# Patient Record
Sex: Female | Born: 1980 | Race: White | Hispanic: No | Marital: Married | Smoking: Never smoker
Health system: Southern US, Community
[De-identification: ages and names within clinical notes are randomized; demographics above are authoritative.]

## PROBLEM LIST (undated history)

## (undated) DIAGNOSIS — E119 Type 2 diabetes mellitus without complications: Secondary | ICD-10-CM

---

## 2009-07-23 ENCOUNTER — Emergency Department (HOSPITAL_COMMUNITY): Admission: EM | Admit: 2009-07-23 | Discharge: 2009-07-24 | Payer: Self-pay | Admitting: Emergency Medicine

## 2014-04-17 ENCOUNTER — Encounter (HOSPITAL_COMMUNITY): Payer: Self-pay | Admitting: Emergency Medicine

## 2014-04-17 ENCOUNTER — Emergency Department (HOSPITAL_COMMUNITY)
Admission: EM | Admit: 2014-04-17 | Discharge: 2014-04-18 | Disposition: A | Discharge status: Home / Self Care | Payer: Self-pay | Attending: Emergency Medicine | Admitting: Emergency Medicine

## 2014-04-17 DIAGNOSIS — Z79899 Other long term (current) drug therapy: Secondary | ICD-10-CM | POA: Insufficient documentation

## 2014-04-17 DIAGNOSIS — Z3202 Encounter for pregnancy test, result negative: Secondary | ICD-10-CM | POA: Insufficient documentation

## 2014-04-17 DIAGNOSIS — N730 Acute parametritis and pelvic cellulitis: Secondary | ICD-10-CM | POA: Insufficient documentation

## 2014-04-17 DIAGNOSIS — R109 Unspecified abdominal pain: Secondary | ICD-10-CM | POA: Insufficient documentation

## 2014-04-17 DIAGNOSIS — Z91040 Latex allergy status: Secondary | ICD-10-CM | POA: Insufficient documentation

## 2014-04-17 DIAGNOSIS — Z792 Long term (current) use of antibiotics: Secondary | ICD-10-CM | POA: Insufficient documentation

## 2014-04-17 DIAGNOSIS — N390 Urinary tract infection, site not specified: Secondary | ICD-10-CM | POA: Insufficient documentation

## 2014-04-17 DIAGNOSIS — R748 Abnormal levels of other serum enzymes: Secondary | ICD-10-CM | POA: Insufficient documentation

## 2014-04-17 LAB — COMPREHENSIVE METABOLIC PANEL
ALBUMIN: 3.8 g/dL (ref 3.5–5.2)
ALK PHOS: 105 U/L (ref 39–117)
ALT: 226 U/L — AB (ref 0–35)
ANION GAP: 14 (ref 5–15)
AST: 155 U/L — ABNORMAL HIGH (ref 0–37)
BUN: 9 mg/dL (ref 6–23)
CALCIUM: 10.3 mg/dL (ref 8.4–10.5)
CO2: 23 mEq/L (ref 19–32)
Chloride: 96 mEq/L (ref 96–112)
Creatinine, Ser: 0.61 mg/dL (ref 0.50–1.10)
GFR calc Af Amer: >90 mL/min (ref >90)
GFR calc non Af Amer: >90 mL/min (ref >90)
Glucose, Bld: 183 mg/dL — ABNORMAL HIGH (ref 70–99)
POTASSIUM: 3.8 meq/L (ref 3.7–5.3)
SODIUM: 133 meq/L — AB (ref 137–147)
TOTAL PROTEIN: 9.7 g/dL — AB (ref 6.0–8.3)
Total Bilirubin: 0.5 mg/dL (ref 0.3–1.2)

## 2014-04-17 LAB — URINALYSIS, ROUTINE W REFLEX MICROSCOPIC
Bilirubin Urine: NEGATIVE
GLUCOSE, UA: NEGATIVE mg/dL
Ketones, ur: NEGATIVE mg/dL
LEUKOCYTES UA: NEGATIVE
NITRITE: NEGATIVE
PH: 6 (ref 5.0–8.0)
PROTEIN: 30 mg/dL — AB
Specific Gravity, Urine: 1.027 (ref 1.005–1.030)
Urobilinogen, UA: 0.2 mg/dL (ref 0.0–1.0)

## 2014-04-17 LAB — LIPASE, BLOOD: Lipase: 29 U/L (ref 11–59)

## 2014-04-17 LAB — CBC WITH DIFFERENTIAL/PLATELET
BASOS ABS: 0 10*3/uL (ref 0.0–0.1)
Basophils Relative: 0 % (ref 0–1)
EOS ABS: 0.3 10*3/uL (ref 0.0–0.7)
Eosinophils Relative: 2 % (ref 0–5)
HCT: 35.7 % — ABNORMAL LOW (ref 36.0–46.0)
Hemoglobin: 11.7 g/dL — ABNORMAL LOW (ref 12.0–15.0)
LYMPHS PCT: 34 % (ref 12–46)
Lymphs Abs: 5.4 10*3/uL — ABNORMAL HIGH (ref 0.7–4.0)
MCH: 23 pg — ABNORMAL LOW (ref 26.0–34.0)
MCHC: 32.8 g/dL (ref 30.0–36.0)
MCV: 70.1 fL — ABNORMAL LOW (ref 78.0–100.0)
MONO ABS: 1.6 10*3/uL — AB (ref 0.1–1.0)
Monocytes Relative: 10 % (ref 3–12)
NEUTROS PCT: 54 % (ref 43–77)
Neutro Abs: 8.7 10*3/uL — ABNORMAL HIGH (ref 1.7–7.7)
PLATELETS: 290 10*3/uL (ref 150–400)
RBC: 5.09 MIL/uL (ref 3.87–5.11)
RDW: 13.9 % (ref 11.5–15.5)
WBC: 16 10*3/uL — ABNORMAL HIGH (ref 4.0–10.5)

## 2014-04-17 LAB — URINE MICROSCOPIC-ADD ON

## 2014-04-17 LAB — POC URINE PREG, ED: PREG TEST UR: NEGATIVE

## 2014-04-17 MED ORDER — ONDANSETRON HCL 4 MG/2ML IJ SOLN
4.0000 mg | Freq: Once | INTRAMUSCULAR | Status: AC
  Administered 2014-04-18: 4 mg via INTRAVENOUS
  Filled 2014-04-17: qty 2

## 2014-04-17 MED ORDER — SODIUM CHLORIDE 0.9 % IV BOLUS (SEPSIS)
1000.0000 mL | Freq: Once | INTRAVENOUS | Status: AC
  Administered 2014-04-18: 1000 mL via INTRAVENOUS

## 2014-04-17 MED ORDER — MORPHINE SULFATE 4 MG/ML IJ SOLN
4.0000 mg | INTRAMUSCULAR | Status: DC | PRN
  Administered 2014-04-18: 4 mg via INTRAVENOUS
  Filled 2014-04-17: qty 1

## 2014-04-17 NOTE — ED Provider Notes (Signed)
CSN: [id number]     Arrival date & time 04/17/14  1756 History   First MD Initiated Contact with Patient 04/17/14 2302     Chief Complaint  Patient presents with  . Abdominal Pain  . Back Pain     (Consider location/radiation/quality/duration/timing/severity/associated sxs/prior Treatment) HPI Comments: Pt with hx of niddm comes in with abd pain. Pt started having suprapubic pain, described as soreness, started 2 days ago. Patient also has lower back pain, middle and across. Patient also has pressure like feeling and sharpness around the vaginal area with urination. + nausea, no emesis, no fevers, chills. + polyuria, no hematuria today. No hx of pelvic disorders. + vaginal discharge, milky, no odor, thick. No hx of STD. PT married, unprotected intercourse with her partner. LMP was 03/15/14.  Patient is a 33 y.o. female presenting with abdominal pain and back pain. The history is provided by the patient.  Abdominal Pain Associated symptoms: dysuria, nausea and vaginal discharge   Associated symptoms: no chest pain, no constipation, no cough, no diarrhea, no fatigue, no fever, no hematuria, no shortness of breath and no vomiting   Back Pain Associated symptoms: abdominal pain and dysuria   Associated symptoms: no chest pain and no fever     History reviewed. No pertinent past medical history. History reviewed. No pertinent past surgical history. No family history on file. History  Substance Use Topics  . Smoking status: Never Smoker   . Smokeless tobacco: Never Used  . Alcohol Use: No   OB History   Grav Para Term Preterm Abortions TAB SAB Ect Mult Living                 Review of Systems  Constitutional: Negative for fever, activity change and fatigue.  HENT: Negative for facial swelling.   Respiratory: Negative for cough, shortness of breath and wheezing.   Cardiovascular: Negative for chest pain.  Gastrointestinal: Positive for nausea and abdominal pain. Negative for  vomiting, diarrhea, constipation, blood in stool and abdominal distention.  Endocrine: Positive for polyuria.  Genitourinary: Positive for dysuria, frequency, flank pain, vaginal discharge and vaginal pain. Negative for hematuria and difficulty urinating.  Musculoskeletal: Positive for back pain. Negative for neck pain.  Skin: Negative for color change.  Allergic/Immunologic: Negative for immunocompromised state.  Neurological: Negative for dizziness.  Hematological: Does not bruise/bleed easily.  Psychiatric/Behavioral: Negative for confusion.      Allergies  Latex  Home Medications   Prior to Admission medications   Medication Sig Start Date End Date Taking? Authorizing Provider  ibuprofen (ADVIL,MOTRIN) 200 MG tablet Take 200-800 mg by mouth every 6 (six) hours as needed for moderate pain.   Yes Historical Provider, MD  metFORMIN (GLUCOPHAGE) 1000 MG tablet Take 1,000 mg by mouth 2 (two) times daily with a meal.   Yes Historical Provider, MD  cephALEXin (KEFLEX) 500 MG capsule Take 1 capsule (500 mg total) by mouth 2 (two) times daily. 04/18/14   Derwood Kaplan, MD  doxycycline (VIBRAMYCIN) 100 MG capsule Take 1 capsule (100 mg total) by mouth 2 (two) times daily. 04/18/14   Derwood Kaplan, MD  promethazine (PHENERGAN) 25 MG suppository Place 1 suppository (25 mg total) rectally every 6 (six) hours as needed for nausea. 04/18/14   Derwood Kaplan, MD  promethazine (PHENERGAN) 25 MG tablet Take 1 tablet (25 mg total) by mouth every 6 (six) hours as needed for nausea. 04/18/14   Derwood Kaplan, MD  traMADol (ULTRAM) 50 MG tablet Take 1 tablet (50 mg  total) by mouth every 6 (six) hours as needed. 04/18/14   Fordyce Lepak Rhunette Croft, MD   BP 138/91  Pulse 102  Temp(Src) 98 F (36.7 C) (Oral)  Resp 16  Wt 197 lb (89.359 kg)  SpO2 99%  LMP 03/16/2014 Physical Exam  Nursing note and vitals reviewed. Constitutional: She is oriented to person, place, and time. She appears well-developed and  well-nourished.  HENT:  Head: Normocephalic and atraumatic.  Eyes: Conjunctivae and EOM are normal. Pupils are equal, round, and reactive to light.  Neck: Normal range of motion. Neck supple.  Cardiovascular: Normal rate, regular rhythm, normal heart sounds and intact distal pulses.   No murmur heard. Pulmonary/Chest: Effort normal. No respiratory distress. She has no wheezes.  Abdominal: Soft. Bowel sounds are normal. She exhibits no distension. There is tenderness. There is no rebound and no guarding.  Suprapubic tenderness, no RUE tenderness  Genitourinary: Vagina normal and uterus normal.  External exam - normal, no lesions Speculum exam: Pt has some white discharge, no blood Bimanual exam: Patient has MILD CMT, no adnexal tenderness or fullness and cervical os is closed  Neurological: She is alert and oriented to person, place, and time.  Skin: Skin is warm and dry.    ED Course  Procedures (including critical care time) Labs Review Labs Reviewed  WET PREP, GENITAL - Abnormal; Notable for the following:    WBC, Wet Prep HPF POC FEW (*)    All other components within normal limits  CBC WITH DIFFERENTIAL - Abnormal; Notable for the following:    WBC 16.0 (*)    Hemoglobin 11.7 (*)    HCT 35.7 (*)    MCV 70.1 (*)    MCH 23.0 (*)    Neutro Abs 8.7 (*)    Lymphs Abs 5.4 (*)    Monocytes Absolute 1.6 (*)    All other components within normal limits  COMPREHENSIVE METABOLIC PANEL - Abnormal; Notable for the following:    Sodium 133 (*)    Glucose, Bld 183 (*)    Total Protein 9.7 (*)    AST 155 (*)    ALT 226 (*)    All other components within normal limits  URINALYSIS, ROUTINE W REFLEX MICROSCOPIC - Abnormal; Notable for the following:    APPearance CLOUDY (*)    Hgb urine dipstick MODERATE (*)    Protein, ur 30 (*)    All other components within normal limits  URINE CULTURE  GC/CHLAMYDIA PROBE AMP  LIPASE, BLOOD  URINE MICROSCOPIC-ADD ON  POC URINE PREG, ED     Imaging Review No results found.   EKG Interpretation None      MDM   Final diagnoses:  UTI (lower urinary tract infection)  PID (acute pelvic inflammatory disease)  Abnormal liver enzymes    DDx includes: SBO ACS syndrome Colitis AAA Tumors Colitis Intra abdominal abscess Thrombosis Mesenteric ischemia Diverticulitis Peritonitis Appendicitis Hernia Nephrolithiasis Pyelonephritis UTI/Cystitis Ovarian cyst TOA Ectopic pregnancy PID STD  Pt comes in with cc of abd pain. Lower quadrant abd pain, with normal urine analysis results. Urine is cultured, pt is diabetic. Pt is also having typical UTI like sx, with dysuria - and given her diabetes, will treat her as if she has a UTI.  No concerns for Ovarian cyst or torsion, based on hx and exam. Pt also has vaginal discharge, with some cervical motion tenderness. Given equivocal findings thus far, despite low risk of STD currently, as she is in a monogamous relationship, we shall treat  her as if she has PID. We discussed this option with the patient, and she is ok with the appropriate measures.  Finally, elevated LFTs. Could be med related? GI f/u given.            Derwood Kaplan, MD 04/18/14 [phone]

## 2014-04-17 NOTE — ED Notes (Signed)
Pt states that she has been having LQ ABD pain and pressure in her vagina. Pt states that she has had some whitish vaginal discharge and some sharp pain during urination and polyuria.

## 2014-04-18 LAB — WET PREP, GENITAL
CLUE CELLS WET PREP: NONE SEEN
Trich, Wet Prep: NONE SEEN
Yeast Wet Prep HPF POC: NONE SEEN

## 2014-04-18 MED ORDER — PROMETHAZINE HCL 25 MG RE SUPP: 25.0000 mg | Freq: Four times a day (QID) | RECTAL | Status: AC | PRN

## 2014-04-18 MED ORDER — AZITHROMYCIN 250 MG PO TABS
1000.0000 mg | ORAL_TABLET | Freq: Once | ORAL | Status: AC
  Administered 2014-04-18: 1000 mg via ORAL
  Filled 2014-04-18: qty 4

## 2014-04-18 MED ORDER — CEPHALEXIN 500 MG PO CAPS: 500.0000 mg | ORAL_CAPSULE | Freq: Two times a day (BID) | ORAL | Status: DC

## 2014-04-18 MED ORDER — DOXYCYCLINE HYCLATE 100 MG PO CAPS: 100.0000 mg | ORAL_CAPSULE | Freq: Two times a day (BID) | ORAL | Status: DC

## 2014-04-18 MED ORDER — DEXTROSE 5 % IV SOLN
1.0000 g | Freq: Once | INTRAVENOUS | Status: AC
  Administered 2014-04-18: 1 g via INTRAVENOUS
  Filled 2014-04-18: qty 10

## 2014-04-18 MED ORDER — PROMETHAZINE HCL 25 MG PO TABS: 25.0000 mg | ORAL_TABLET | Freq: Four times a day (QID) | ORAL | Status: AC | PRN

## 2014-04-18 MED ORDER — TRAMADOL HCL 50 MG PO TABS: 50.0000 mg | ORAL_TABLET | Freq: Four times a day (QID) | ORAL | Status: AC | PRN

## 2014-04-18 NOTE — Discharge Instructions (Signed)
We saw you in the ER for the abdominal pain. Concerns for possible Urinary tract infection, and also pelvic infection. Take the medications provided. See the GI doctor  -as your liver enzymes are slightly elevated. See your primary doctor in 1 week for a routine follow up.  Please return to the ER if your symptoms worsen; you have increased pain, fevers, chills, inability to keep any medications down, confusion. Otherwise see the outpatient doctor as requested.   Pelvic Inflammatory Disease Pelvic inflammatory disease (PID) refers to an infection in some or all of the female organs. The infection can be in the uterus, ovaries, fallopian tubes, or the surrounding tissues in the pelvis. PID can cause abdominal or pelvic pain that comes on suddenly (acute pelvic pain). PID is a serious infection because it can lead to lasting (chronic) pelvic pain or the inability to have children (infertile).  CAUSES  The infection is often caused by the normal bacteria found in the vaginal tissues. PID may also be caused by an infection that is spread during sexual contact. PID can also occur following:   The birth of a baby.   A miscarriage.   An abortion.   Major pelvic surgery.   The use of an intrauterine device (IUD).   A sexual assault.  RISK FACTORS Certain factors can put a person at higher risk for PID, such as:  Being younger than 25 years.  Being sexually active at [place] age.  Usingnonbarrier contraception.  Havingmultiple sexual partners.  Having sex with someone who has symptoms of a genital infection.  Using oral contraception. Other times, certain behaviors can increase the possibility of getting PID, such as:  Having sex during your period.  Using a vaginal douche.  Having an intrauterine device (IUD) in place. SYMPTOMS   Abdominal or pelvic pain.   Fever.   Chills.   Abnormal vaginal discharge.  Abnormal uterine bleeding.   Unusual pain shortly  after finishing your period. DIAGNOSIS  Your caregiver will choose some of the following methods to make a diagnosis, such as:   Performinga physical exam and history. A pelvic exam typically reveals a very tender uterus and surrounding pelvis.   Ordering laboratory tests including a pregnancy test, blood tests, and urine test.  Orderingcultures of the vagina and cervix to check for a sexually transmitted infection (STI).  Performing an ultrasound.   Performing a laparoscopic procedure to look inside the pelvis.  TREATMENT   Antibiotic medicines may be prescribed and taken by mouth.   Sexual partners may be treated when the infection is caused by a sexually transmitted disease (STD).   Hospitalization may be needed to give antibiotics intravenously.  Surgery may be needed, but this is rare. It may take weeks until you are completely well. If you are diagnosed with PID, you should also be checked for human immunodeficiency virus (HIV). HOME CARE INSTRUCTIONS   If given, take your antibiotics as directed. Finish the medicine even if you start to feel better.   Only take over-the-counter or prescription medicines for pain, discomfort, or fever as directed by your caregiver.   Do not have sexual intercourse until treatment is completed or as directed by your caregiver. If PID is confirmed, your recent sexual partner(s) will need treatment.   Keep your follow-up appointments. SEEK MEDICAL CARE IF:   You have increased or abnormal vaginal discharge.   You need prescription medicine for your pain.   You vomit.   You cannot take your medicines.  Your partner has an STD.  SEEK IMMEDIATE MEDICAL CARE IF:   You have a fever.   You have increased abdominal or pelvic pain.   You have chills.   You have pain when you urinate.   You are not better after 72 hours following treatment.  MAKE SURE YOU:   Understand these instructions.  Will watch your  condition.  Will get help right away if you are not doing well or get worse. Document Released: 07/21/2005 Document Revised: 11/15/2012 Document Reviewed: 07/17/2011 [facility] Patient Information 2015 [place], [place]. This information is not intended to replace advice given to you by your health care provider. Make sure you discuss any questions you have with your health care provider.  Urinary Tract Infection Urinary tract infections (UTIs) can develop anywhere along your urinary tract. Your urinary tract is your body's drainage system for removing wastes and extra water. Your urinary tract includes two kidneys, two ureters, a bladder, and a urethra. Your kidneys are a pair of bean-shaped organs. Each kidney is about the size of your fist. They are located below your ribs, one on each side of your spine. CAUSES Infections are caused by microbes, which are microscopic organisms, including fungi, viruses, and bacteria. These organisms are so small that they can only be seen through a microscope. Bacteria are the microbes that most commonly cause UTIs. SYMPTOMS  Symptoms of UTIs may vary by age and gender of the patient and by the location of the infection. Symptoms in young women typically include a frequent and intense urge to urinate and a painful, burning feeling in the bladder or urethra during urination. Older women and men are more likely to be tired, shaky, and weak and have muscle aches and abdominal pain. A fever may mean the infection is in your kidneys. Other symptoms of a kidney infection include pain in your back or sides below the ribs, nausea, and vomiting. DIAGNOSIS To diagnose a UTI, your caregiver will ask you about your symptoms. Your caregiver also will ask to provide a urine sample. The urine sample will be tested for bacteria and white blood cells. White blood cells are made by your body to help fight infection. TREATMENT  Typically, UTIs can be treated with medication. Because  most UTIs are caused by a bacterial infection, they usually can be treated with the use of antibiotics. The choice of antibiotic and length of treatment depend on your symptoms and the type of bacteria causing your infection. HOME CARE INSTRUCTIONS  If you were prescribed antibiotics, take them exactly as your caregiver instructs you. Finish the medication even if you feel better after you have only taken some of the medication.  Drink enough water and fluids to keep your urine clear or pale yellow.  Avoid caffeine, tea, and carbonated beverages. They tend to irritate your bladder.  Empty your bladder often. Avoid holding urine for long periods of time.  Empty your bladder before and after sexual intercourse.  After a bowel movement, women should cleanse from front to back. Use each tissue only once. SEEK MEDICAL CARE IF:   You have back pain.  You develop a fever.  Your symptoms do not begin to resolve within 3 days. SEEK IMMEDIATE MEDICAL CARE IF:   You have severe back pain or lower abdominal pain.  You develop chills.  You have nausea or vomiting.  You have continued burning or discomfort with urination. MAKE SURE YOU:   Understand these instructions.  Will watch your condition.  Will get help right away if you are not doing well or get worse. Document Released: 04/30/2005 Document Revised: 01/20/2012 Document Reviewed: 08/29/2011 [facility] Patient Information 2015 [place], [place]. This information is not intended to replace advice given to you by your health care provider. Make sure you discuss any questions you have with your health care provider.

## 2014-04-19 LAB — URINE CULTURE
CULTURE: NO GROWTH
Colony Count: NO GROWTH
SPECIAL REQUESTS: NORMAL

## 2014-04-19 LAB — GC/CHLAMYDIA PROBE AMP
CT PROBE, AMP APTIMA: NEGATIVE
GC PROBE AMP APTIMA: NEGATIVE

## 2014-09-05 ENCOUNTER — Emergency Department (HOSPITAL_BASED_OUTPATIENT_CLINIC_OR_DEPARTMENT_OTHER): Payer: Self-pay

## 2014-09-05 ENCOUNTER — Emergency Department (HOSPITAL_BASED_OUTPATIENT_CLINIC_OR_DEPARTMENT_OTHER)
Admission: EM | Admit: 2014-09-05 | Discharge: 2014-09-05 | Disposition: A | Discharge status: Home / Self Care | Payer: Self-pay | Attending: Emergency Medicine | Admitting: Emergency Medicine

## 2014-09-05 ENCOUNTER — Encounter (HOSPITAL_BASED_OUTPATIENT_CLINIC_OR_DEPARTMENT_OTHER): Payer: Self-pay | Admitting: *Deleted

## 2014-09-05 DIAGNOSIS — W19XXXA Unspecified fall, initial encounter: Secondary | ICD-10-CM

## 2014-09-05 DIAGNOSIS — L0291 Cutaneous abscess, unspecified: Secondary | ICD-10-CM

## 2014-09-05 DIAGNOSIS — M545 Low back pain, unspecified: Secondary | ICD-10-CM

## 2014-09-05 DIAGNOSIS — W108XXA Fall (on) (from) other stairs and steps, initial encounter: Secondary | ICD-10-CM | POA: Insufficient documentation

## 2014-09-05 DIAGNOSIS — Y998 Other external cause status: Secondary | ICD-10-CM | POA: Insufficient documentation

## 2014-09-05 DIAGNOSIS — S3992XA Unspecified injury of lower back, initial encounter: Secondary | ICD-10-CM | POA: Insufficient documentation

## 2014-09-05 DIAGNOSIS — Z91040 Latex allergy status: Secondary | ICD-10-CM | POA: Insufficient documentation

## 2014-09-05 DIAGNOSIS — Z792 Long term (current) use of antibiotics: Secondary | ICD-10-CM | POA: Insufficient documentation

## 2014-09-05 DIAGNOSIS — Y9289 Other specified places as the place of occurrence of the external cause: Secondary | ICD-10-CM | POA: Insufficient documentation

## 2014-09-05 DIAGNOSIS — Z79899 Other long term (current) drug therapy: Secondary | ICD-10-CM | POA: Insufficient documentation

## 2014-09-05 DIAGNOSIS — L02211 Cutaneous abscess of abdominal wall: Secondary | ICD-10-CM | POA: Insufficient documentation

## 2014-09-05 DIAGNOSIS — Y9389 Activity, other specified: Secondary | ICD-10-CM | POA: Insufficient documentation

## 2014-09-05 MED ORDER — HYDROCODONE-ACETAMINOPHEN 5-325 MG PO TABS: 1.0000 | ORAL_TABLET | Freq: Four times a day (QID) | ORAL | Status: DC | PRN

## 2014-09-05 MED ORDER — OXYCODONE-ACETAMINOPHEN 5-325 MG PO TABS
2.0000 | ORAL_TABLET | Freq: Once | ORAL | Status: AC
  Administered 2014-09-05: 2 via ORAL
  Filled 2014-09-05: qty 2

## 2014-09-05 MED ORDER — LIDOCAINE-EPINEPHRINE (PF) 2 %-1:200000 IJ SOLN
10.0000 mL | Freq: Once | INTRAMUSCULAR | Status: AC
  Administered 2014-09-05: 10 mL
  Filled 2014-09-05: qty 20

## 2014-09-05 MED ORDER — CEPHALEXIN 500 MG PO CAPS: 500.0000 mg | ORAL_CAPSULE | Freq: Four times a day (QID) | ORAL | Status: DC

## 2014-09-05 MED ORDER — SULFAMETHOXAZOLE-TRIMETHOPRIM 800-160 MG PO TABS: 2.0000 | ORAL_TABLET | Freq: Two times a day (BID) | ORAL | Status: DC

## 2014-09-05 NOTE — ED Notes (Signed)
Patient transported to X-ray 

## 2014-09-05 NOTE — ED Notes (Addendum)
States she fell on the steps and hurt her back. Redness to her mid back that looks like a heating pad burn. She also wants to have a reddened area to her left lower quadrant to be checked for possible abscess. Lethargic at triage unable to keep her eyes open.
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
## 2014-09-24 ENCOUNTER — Encounter (HOSPITAL_BASED_OUTPATIENT_CLINIC_OR_DEPARTMENT_OTHER): Payer: Self-pay | Admitting: *Deleted

## 2014-09-24 ENCOUNTER — Emergency Department (HOSPITAL_BASED_OUTPATIENT_CLINIC_OR_DEPARTMENT_OTHER)
Admission: EM | Admit: 2014-09-24 | Discharge: 2014-09-24 | Disposition: A | Discharge status: Home / Self Care | Payer: Self-pay | Attending: Emergency Medicine | Admitting: Emergency Medicine

## 2014-09-24 DIAGNOSIS — K0889 Other specified disorders of teeth and supporting structures: Secondary | ICD-10-CM

## 2014-09-24 DIAGNOSIS — E119 Type 2 diabetes mellitus without complications: Secondary | ICD-10-CM | POA: Insufficient documentation

## 2014-09-24 DIAGNOSIS — K088 Other specified disorders of teeth and supporting structures: Secondary | ICD-10-CM | POA: Insufficient documentation

## 2014-09-24 DIAGNOSIS — Z79899 Other long term (current) drug therapy: Secondary | ICD-10-CM | POA: Insufficient documentation

## 2014-09-24 DIAGNOSIS — Z792 Long term (current) use of antibiotics: Secondary | ICD-10-CM | POA: Insufficient documentation

## 2014-09-24 DIAGNOSIS — Z91040 Latex allergy status: Secondary | ICD-10-CM | POA: Insufficient documentation

## 2014-09-24 HISTORY — DX: Type 2 diabetes mellitus without complications: E11.9

## 2014-09-24 MED ORDER — HYDROCODONE-ACETAMINOPHEN 5-325 MG PO TABS: 1.0000 | ORAL_TABLET | ORAL | Status: AC | PRN

## 2014-09-24 MED ORDER — IBUPROFEN 800 MG PO TABS
800.0000 mg | ORAL_TABLET | Freq: Once | ORAL | Status: AC
  Administered 2014-09-24: 800 mg via ORAL
  Filled 2014-09-24: qty 1

## 2014-09-24 MED ORDER — HYDROCODONE-ACETAMINOPHEN 5-325 MG PO TABS
2.0000 | ORAL_TABLET | Freq: Once | ORAL | Status: AC
  Administered 2014-09-24: 2 via ORAL
  Filled 2014-09-24: qty 2

## 2014-09-24 MED ORDER — IBUPROFEN 800 MG PO TABS: 800.0000 mg | ORAL_TABLET | Freq: Three times a day (TID) | ORAL | Status: AC | PRN

## 2014-09-24 NOTE — ED Notes (Signed)
Pt admits to left/right lower dental pain x2 months - progressively worse - unable to tolerate pain today. Has taken tylenol and ibuprofen for pain w/o relief.

## 2014-09-24 NOTE — ED Provider Notes (Signed)
TIME SEEN: This chart was scribed for Rebecca MawKristen N Ellise Hayden, Rebecca Hayden by Murriel HopperAlec Bankhead, ED Scribe. This patient was seen in room MH05/MH05 and the patient's care was started at 11:10 PM.  CHIEF COMPLAINT:  Chief Complaint  Patient presents with  . Dental Pain     HPI: HPI Comments: Rebecca Hayden is a 34 y.o. female with history of diabetes who presents to the Emergency Department complaining of constant, worsening bilateral mandibular dental pain that has been present for several weeks. One molar on both the left and right side of her mandibular jaw are causing her pain, as she notes she may have chipped them at some point in the last couple of weeks. Pt states that she has aching pain from her chipped teeth, and that her pain radiates into her head and jaw to give her headaches and jaw pain. Denies fever. No facial swelling or erythema. No trismus or drooling. States she does not have a Education officer, communitydentist.    ROS: See HPI Constitutional: no fever  Eyes: no drainage  ENT: no runny nose , dental pain Cardiovascular:  no chest pain  Resp: no SOB  GI: no vomiting GU: no dysuria Integumentary: no rash  Allergy: no hives  Musculoskeletal: no leg swelling  Neurological: no slurred speech, headache ROS otherwise negative  PAST MEDICAL HISTORY/PAST SURGICAL HISTORY:  Past Medical History  Diagnosis Date  . Diabetes mellitus without complication     MEDICATIONS:  Prior to Admission medications   Medication Sig Start Date End Date Taking? Authorizing Provider  cephALEXin (KEFLEX) 500 MG capsule Take 1 capsule (500 mg total) by mouth 4 (four) times daily. 09/05/14   Santiago GladHeather Laisure, PA-C  doxycycline (VIBRAMYCIN) 100 MG capsule Take 1 capsule (100 mg total) by mouth 2 (two) times daily. 04/18/14   Derwood KaplanAnkit Nanavati, MD  HYDROcodone-acetaminophen (NORCO/VICODIN) 5-325 MG per tablet Take 1-2 tablets by mouth every 6 (six) hours as needed. 09/05/14   Heather Laisure, PA-C  ibuprofen (ADVIL,MOTRIN) 200 MG tablet Take  200-800 mg by mouth every 6 (six) hours as needed for moderate pain.    Historical Provider, MD  metFORMIN (GLUCOPHAGE) 1000 MG tablet Take 1,000 mg by mouth 2 (two) times daily with a meal.    Historical Provider, MD  promethazine (PHENERGAN) 25 MG suppository Place 1 suppository (25 mg total) rectally every 6 (six) hours as needed for nausea. 04/18/14   Derwood KaplanAnkit Nanavati, MD  promethazine (PHENERGAN) 25 MG tablet Take 1 tablet (25 mg total) by mouth every 6 (six) hours as needed for nausea. 04/18/14   Derwood KaplanAnkit Nanavati, MD  sulfamethoxazole-trimethoprim (SEPTRA DS) 800-160 MG per tablet Take 2 tablets by mouth every 12 (twelve) hours. 09/05/14   Santiago GladHeather Laisure, PA-C  traMADol (ULTRAM) 50 MG tablet Take 1 tablet (50 mg total) by mouth every 6 (six) hours as needed. 04/18/14   Derwood KaplanAnkit Nanavati, MD    ALLERGIES:  Allergies  Allergen Reactions  . Latex Hives and Itching    SOCIAL HISTORY:  History  Substance Use Topics  . Smoking status: Never Smoker   . Smokeless tobacco: Never Used  . Alcohol Use: No    FAMILY HISTORY: History reviewed. No pertinent family history.  EXAM: BP 115/78 mmHg  Pulse 112  Temp(Src) 98.4 F (36.9 C) (Oral)  Resp 20  Ht 5' (1.524 m)  Wt 210 lb (95.255 kg)  BMI 41.01 kg/m2  SpO2 100%  LMP 09/18/2014 (Approximate) CONSTITUTIONAL: Alert and oriented and responds appropriately to questions. Well-appearing; well-nourished HEAD: Normocephalic EYES: Conjunctivae  clear, PERRL ENT: normal nose; no rhinorrhea; moist mucous membranes; pharynx without lesions noted, dental caries bilateral bottom 3rd molars have chipped areas with no dentin exposure, gum surrounding left third molar is inflamed and irritated without signs of abscess, no trismus or drooling, no submandibular swelling, normal phonation; tongue sits flat in the bottom of the mouth; no tonsillar hypertrophy or exudate, no uvular deviation NECK: Supple, no meningismus, no LAD  CARD: RRR; S1 and S2 appreciated; no  murmurs, no clicks, no rubs, no gallops RESP: Normal chest excursion without splinting or tachypnea; breath sounds clear and equal bilaterally; no wheezes, no rhonchi, no rales,  ABD/GI: Normal bowel sounds; non-distended; soft, non-tender, no rebound, no guarding BACK:  The back appears normal and is non-tender to palpation, there is no CVA tenderness EXT: Normal ROM in all joints; non-tender to palpation; no edema; normal capillary refill; no cyanosis    SKIN: Normal color for age and race; warm NEURO: Moves all extremities equally PSYCH: The patient's mood and manner are appropriate. Grooming and personal hygiene are appropriate.  MEDICAL DECISION MAKING: Patient here with dental pain from dental caries in chipped teeth and placed him teeth that are coming in and causing gum irritation. No obvious sign of abscess but will discharge on antibiotics and with pain medicine. Will give outpatient dental follow-up information. Discussed return precautions. No sign of Ludwig angina on exam. She verbalizes understanding and is comfortable with plan.    1:30 AM  Called in PCN  QID x 10 days to [facility] at [phone].  Discussed this with patient, called her at her home at [phone].  She will pick this up from the pharmacy tomorrow.    I personally performed the services described in this documentation, which was scribed in my presence. The recorded information has been reviewed and is accurate.   Rebecca Hayden, Rebecca Hayden [phone]

## 2014-09-24 NOTE — Discharge Instructions (Signed)
Dental Pain °A tooth ache may be caused by cavities (tooth decay). Cavities expose the nerve of the tooth to air and hot or cold temperatures. It may come from an infection or abscess (also called a boil or furuncle) around your tooth. It is also often caused by dental caries (tooth decay). This causes the pain you are having. °DIAGNOSIS  °Your caregiver can diagnose this problem by exam. °TREATMENT  °· If caused by an infection, it may be treated with medications which kill germs (antibiotics) and pain medications as prescribed by your caregiver. Take medications as directed. °· Only take over-the-counter or prescription medicines for pain, discomfort, or fever as directed by your caregiver. °· Whether the tooth ache today is caused by infection or dental disease, you should see your dentist as soon as possible for further care. °SEEK MEDICAL CARE IF: °The exam and treatment you received today has been provided on an emergency basis only. This is not a substitute for complete medical or dental care. If your problem worsens or new problems (symptoms) appear, and you are unable to meet with your dentist, call or return to this location. °SEEK IMMEDIATE MEDICAL CARE IF:  °· You have a fever. °· You develop redness and swelling of your face, jaw, or neck. °· You are unable to open your mouth. °· You have severe pain uncontrolled by pain medicine. °MAKE SURE YOU:  °· Understand these instructions. °· Will watch your condition. °· Will get help right away if you are not doing well or get worse. °Document Released: 07/21/2005 Document Revised: 10/13/2011 Document Reviewed: 03/08/2008 °ExitCare® Patient Information ©2015 [employer]. This information is not intended to replace advice given to you by your health care provider. Make sure you discuss any questions you have with your health care provider. ° °Emergency Department Resource Guide °1) Find a Doctor and Pay Out of Pocket °Although you won't have to find out who  is covered by your insurance plan, it is a good idea to ask around and get recommendations. You will then need to call the office and see if the doctor you have chosen will accept you as a new patient and what types of options they offer for patients who are self-pay. Some doctors offer discounts or will set up payment plans for their patients who do not have insurance, but you will need to ask so you aren't surprised when you get to your appointment. ° °2) Contact Your Local Health Department °Not all health departments have doctors that can see patients for sick visits, but many do, so it is worth a call to see if yours does. If you don't know where your local health department is, you can check in your phone book. The CDC also has a tool to help you locate your state's health department, and many state websites also have listings of all of their local health departments. ° °3) Find a Walk-in Clinic °If your illness is not likely to be very severe or complicated, you may want to try a walk in clinic. These are popping up all over the country in pharmacies, drugstores, and shopping centers. They're usually staffed by nurse practitioners or physician assistants that have been trained to treat common illnesses and complaints. They're usually fairly quick and inexpensive. However, if you have serious medical issues or chronic medical problems, these are probably not your best option. ° °No Primary Care Doctor: °- Call [facility] at  832-8000 - they can help you locate a primary   care doctor that  accepts your insurance, provides certain services, etc. °- Physician Referral Service- 1-800-533-3463 ° °Chronic Pain Problems: °Organization         Address  Phone   Notes  °[facility]  (336) 297-2271 Patients need to be referred by their primary care doctor.  ° °Medication Assistance: °Organization         Address  Phone   Notes  °[place] [facility] [address]  [address] °[place], [place] [address] (336) 641-8030 --Must be a resident of [place] °-- Must have NO insurance coverage whatsoever (no Medicaid/ Medicare, etc.) °-- The pt. MUST have a primary care doctor that directs their care regularly and follows them in the community °  °[facility]  (866) 331-1348   °[employer]  (888) 892-1162   ° °Agencies that provide inexpensive medical care: °Organization         Address  Phone   Notes  °[facility]  (336) 832-8035   °[facility]    (336) 832-7272   °[facility] [address] °[place], [place] [address] (336) 832-4777   °[facility] [address], °[place] (336) 271-4999   °[facility]    (336) 373-0678   °[facility]    (336) 272-1050   °[facility] ° [address], [place] Phone:  (336) 832-4444, Fax:  (336) 832-4440 Hours of Operation:  9 am - 6 pm, M-F.  Also accepts Medicaid/Medicare and self-pay.  °[facility] ° [address], [place] Phone: (336) 832-3150, Fax: (336) 832-3151. Hours of Operation:  8:30 am - 5:30 pm, M-F.  Also accepts Medicaid and self-pay.  °[facility] [place] [address], [place] Phone: (336) 878-6027   °[facility] [address], [place], [place] (336)723-1848, Ext. 123 Mondays & Thursdays: 7-9 AM.  First 15 patients are seen on a first come, first serve basis. °  ° °Medicaid-accepting [place] Providers: ° °Organization         Address  Phone   Notes  °[facility] [address], [place] (336) 641-2100 Also accepts self-pay patients.  °[facility] [address], [place] ° (336) 856-9996   °[facility] [address], [place] (336) 288-8857   °[facility] [address] [place] [address], [place] (336) 299-7000   °Veita Bland [address], [place]  ° (336) 373-1557 Only accepts [place] Access Medicaid patients after they have their name applied to their card.  ° °Self-Pay (no insurance) in [place]: ° °Organization         Address  Phone   Notes  °Sickle Cell Patients, [facility] [address], [place] (336) 832-1970   °[facility] [address], [place] (336) 832-4400   °[facility] ° [address], [place] (336) 992-4800   °[facility]/Dr. Osei-Bonsu ° [address] [place] [address], [place] or [address], [place] (336) 841-8500 Phone number for both [place] and [place] locations is the same.  °[facility] [address], [place] (336) 299-0000   °[facility] [address] [place] [address], [place] or [address] (336) 852-7530 °(336) 878-2260   °  [facility] [address], [place] (336) 350-1642, phone; (336) 294-5005, fax Sees patients 1st and 3rd Saturday of every month.  Must not qualify for public or private insurance (i.e. Medicaid, Medicare, [place] Health Choice, Veterans' Benefits) • Household income should be no more than 200% of the poverty level •The clinic cannot treat you if you are pregnant or think you are pregnant • Sexually transmitted diseases are not treated at the clinic.  ° ° °Dental Care: °Organization         Address  Phone  Notes  °[facility] [facility] [address], [place] (336) 641-6152 Accepts children up to age 21 who are enrolled in Medicaid or [place] Health Choice; pregnant women with a Medicaid card; and children who have applied for Medicaid or [place] Health Choice, but were declined, whose parents can pay a reduced fee at time of service.  °[facility] [place]  [address], [place] (336) 641-7733 Accepts children up to age 21 who are  enrolled in Medicaid or [place] Health Choice; pregnant women with a Medicaid card; and children who have applied for Medicaid or [place] Health Choice, but were declined, whose parents can pay a reduced fee at time of service.  °[facility] ° [address], [place] (336) 641-4533 Patients are seen by appointment only. Walk-ins are not accepted. [facility] will see patients 18 years of age and older. °Monday - Tuesday (8am-5pm) °Most Wednesdays (8:30-5pm) °$30 per visit, cash only  °[facility] ° [address], [place] (336) 641-4533 Patients are seen by appointment only. Walk-ins are not accepted. [facility] will see patients 18 years of age and older. °One Wednesday Evening (Monthly: Volunteer Based).  $30 per visit, cash only  °[facility]  (919) 537-3737 for adults; Children under age 4, call Graduate Pediatric Dentistry at (919) [phone]. Children aged 4-14, please call (919) [phone] to request a pediatric application. ° Dental services are provided in all areas of dental care including fillings, crowns and bridges, complete and partial dentures, implants, gum treatment, root canals, and extractions. Preventive care is also provided. Treatment is provided to both adults and children. °Patients are selected via a lottery and there is often a waiting list. °  °[facility] [address], °[place] ° (336) 763-8833 [email or web address] °  °[facility] [address], [place], [place] (336)723-1848, Ext. 123 Second and Fourth Thursday of each month, opens at 6:30 AM; Clinic ends at 9 AM.  Patients are seen on a first-come first-served basis, and a limited number are seen during each clinic.  ° °[facility] ° [address], [place], [place] (336) 723-7904   Eligibility Requirements °You must have lived in [place], [place], or [place] counties for at least the last three months. °  You  cannot be eligible for state or federal sponsored healthcare insurance, including Veterans Administration, Medicaid, or Medicare. °  You generally cannot be eligible for healthcare insurance through your employer.  °  How to apply: °Eligibility screenings are held every Tuesday and Wednesday afternoon from 1:00 pm until 4:00 pm. You do not need an appointment for the interview!  °[facility] [address], [place], [place] 336-631-2330   °[facility] [facility]  336-342-8273   °[place] [facility]  336-703-3100   °[facility]  336-570-6415   ° °Behavioral Health Resources in the Community: °Intensive Outpatient Programs °Organization         Address  Phone  Notes  °[facility] [address], [place], [place] 336-878-6098   °[facility] [address], [place], [place] 336-832-9800   °[facility] [address], [place], [place] ° 336-882-2125   °[facility] [address],  °[place], [place] 1-800-853-5163 or 336-641-4981   °Substance Abuse Resources °Organization         Address  Phone  Notes  °[facility]  336-882-2125   °[facility]  336-784-9470   °[facility]  336-285-9073   °[facility]  336-845-3988   °Residential & Outpatient Substance Abuse Program  1-800-659-3381   °Psychological Services °Organization         Address  Phone  Notes  ° Health  336- 832-9600   °[facility]  336- 378-7881   °[facility] [address], [place] 1-800-853-5163 or 336-641-4981   ° °Mobile Crisis Teams °Organization         Address  Phone  Notes  °[facility], Mobile Crisis Care Unit  1-877-626-1772   °Assertive °[facility] ° [address] [place], [place] 336-834-9664   °Sharon DeEsch [address] °[place] 336-554-5454   ° °Self-Help/Support  Groups °Organization         Address  Phone             Notes  °[facility] [place] - variety of support groups  336- 373-1402 Call for more information  °Narcotics Anonymous (NA), [facility] [address], °[place]  2 meetings at this location  ° °Residential Treatment Programs °Organization         Address  Phone  Notes  °[facility] [address],    °[place]  1-866-801-8205   °[facility] ° [address], [place], [place] 704-293-8524   °[facility] [address], [place] 336-845-3988 Admissions: 8am-3pm M-F  °[facility] [address]    °[place], [place] 336-841-1104   °[facility] [address], [place], [place] 336-379-7146   °[facility] [address]  °[place], [place] 336-285-9073   °[facility] - Intensive Outpatient [address], [place], [place] 336-852-3033   °[facility] [address]  °[place], [place] 1-877-615-2722 or 336-784-9470   °[facility] [address] [place], [place] 336-227-7417 Accepts Medicaid  °[facility] [address]  ° [place] 1-800-659-3381 Substance Abuse/Addiction Treatment  ° °[place] Behavioral Health Resources °Organization         Address  Phone  Notes  °[facility]  (888) 581-9988   °Julie Brannon, PhD [address] [place], [place]   (336) 349-5553 or (336) 951-0000   °[facility]   [address] °[place], [place] (336) 349-4454   °[facility] [address], [place], [place] (336) 342-8316 Insurance/Medicaid/sponsorship through [facility]  °[facility] [address]                                    [place],  (336) 342-8316 Therapy/tele-psych/case  °[facility]   [address]  ° [place], [place] (336) 349-2233    °Dr. Arfeen  (336) 349-4544   °[facility] [place]  [employer] [facility] [place] [facility] 1) [address], [place] °2) [address], [place] °3)  [address], [place] (336) 349-3220 °(336) 342-7768 ° °(336) 342-8140   °[place] [facility] (336) 342-1394 or (336) 342-3537 (After Hours)    ° ° ° °

## 2014-11-04 ENCOUNTER — Encounter (HOSPITAL_BASED_OUTPATIENT_CLINIC_OR_DEPARTMENT_OTHER): Payer: Self-pay | Admitting: *Deleted

## 2014-11-04 ENCOUNTER — Emergency Department (HOSPITAL_BASED_OUTPATIENT_CLINIC_OR_DEPARTMENT_OTHER): Payer: Self-pay

## 2014-11-04 ENCOUNTER — Emergency Department (HOSPITAL_BASED_OUTPATIENT_CLINIC_OR_DEPARTMENT_OTHER)
Admission: EM | Admit: 2014-11-04 | Discharge: 2014-11-04 | Disposition: A | Discharge status: Home / Self Care | Payer: Self-pay | Attending: Emergency Medicine | Admitting: Emergency Medicine

## 2014-11-04 DIAGNOSIS — Z79899 Other long term (current) drug therapy: Secondary | ICD-10-CM | POA: Insufficient documentation

## 2014-11-04 DIAGNOSIS — Y9289 Other specified places as the place of occurrence of the external cause: Secondary | ICD-10-CM | POA: Insufficient documentation

## 2014-11-04 DIAGNOSIS — S80212A Abrasion, left knee, initial encounter: Secondary | ICD-10-CM | POA: Insufficient documentation

## 2014-11-04 DIAGNOSIS — Y9301 Activity, walking, marching and hiking: Secondary | ICD-10-CM | POA: Insufficient documentation

## 2014-11-04 DIAGNOSIS — Z91040 Latex allergy status: Secondary | ICD-10-CM | POA: Insufficient documentation

## 2014-11-04 DIAGNOSIS — S93402A Sprain of unspecified ligament of left ankle, initial encounter: Secondary | ICD-10-CM | POA: Insufficient documentation

## 2014-11-04 DIAGNOSIS — Z792 Long term (current) use of antibiotics: Secondary | ICD-10-CM | POA: Insufficient documentation

## 2014-11-04 DIAGNOSIS — Y998 Other external cause status: Secondary | ICD-10-CM | POA: Insufficient documentation

## 2014-11-04 DIAGNOSIS — W010XXA Fall on same level from slipping, tripping and stumbling without subsequent striking against object, initial encounter: Secondary | ICD-10-CM | POA: Insufficient documentation

## 2014-11-04 DIAGNOSIS — E119 Type 2 diabetes mellitus without complications: Secondary | ICD-10-CM | POA: Insufficient documentation

## 2014-11-04 MED ORDER — NAPROXEN 500 MG PO TABS: 500.0000 mg | ORAL_TABLET | Freq: Two times a day (BID) | ORAL | Status: DC

## 2014-11-04 NOTE — ED Notes (Addendum)
C/o left ankle pain, states she twisted it earlier today. Fell onto carpet. States she just tripped.

## 2014-11-04 NOTE — ED Notes (Signed)
Pt states tripped over the carpet edge, today approx noon, fell on Lt knee , abrasion noted. Pt states took Ibuprofen for pain, unrelieved, took med at approx 1300hrs per pt statement

## 2014-11-04 NOTE — Discharge Instructions (Signed)
xrays do not show any fractures. Keep leg elevated. Ice several times a day. Naprosyn for pain and inflammation. Follow up with primary care doctor for recheck.    Ankle Sprain An ankle sprain is an injury to the strong, fibrous tissues (ligaments) that hold the bones of your ankle joint together.  CAUSES An ankle sprain is usually caused by a fall or by twisting your ankle. Ankle sprains most commonly occur when you step on the outer edge of your foot, and your ankle turns inward. People who participate in sports are more prone to these types of injuries.  SYMPTOMS   Pain in your ankle. The pain may be present at rest or only when you are trying to stand or walk.  Swelling.  Bruising. Bruising may develop immediately or within 1 to 2 days after your injury.  Difficulty standing or walking, particularly when turning corners or changing directions. DIAGNOSIS  Your caregiver will ask you details about your injury and perform a physical exam of your ankle to determine if you have an ankle sprain. During the physical exam, your caregiver will press on and apply pressure to specific areas of your foot and ankle. Your caregiver will try to move your ankle in certain ways. An X-ray exam may be done to be sure a bone was not broken or a ligament did not separate from one of the bones in your ankle (avulsion fracture).  TREATMENT  Certain types of braces can help stabilize your ankle. Your caregiver can make a recommendation for this. Your caregiver may recommend the use of medicine for pain. If your sprain is severe, your caregiver may refer you to a surgeon who helps to restore function to parts of your skeletal system (orthopedist) or a physical therapist. HOME CARE INSTRUCTIONS   Apply ice to your injury for 1-2 days or as directed by your caregiver. Applying ice helps to reduce inflammation and pain.  Put ice in a plastic bag.  Place a towel between your skin and the bag.  Leave the ice on  for 15-20 minutes at a time, every 2 hours while you are awake.  Only take over-the-counter or prescription medicines for pain, discomfort, or fever as directed by your caregiver.  Elevate your injured ankle above the level of your heart as much as possible for 2-3 days.  If your caregiver recommends crutches, use them as instructed. Gradually put weight on the affected ankle. Continue to use crutches or a cane until you can walk without feeling pain in your ankle.  If you have a plaster splint, wear the splint as directed by your caregiver. Do not rest it on anything harder than a pillow for the first 24 hours. Do not put weight on it. Do not get it wet. You may take it off to take a shower or bath.  You may have been given an elastic bandage to wear around your ankle to provide support. If the elastic bandage is too tight (you have numbness or tingling in your foot or your foot becomes cold and blue), adjust the bandage to make it comfortable.  If you have an air splint, you may blow more air into it or let air out to make it more comfortable. You may take your splint off at night and before taking a shower or bath. Wiggle your toes in the splint several times per day to decrease swelling. SEEK MEDICAL CARE IF:   You have rapidly increasing bruising or swelling.  Your toes  feel extremely cold or you lose feeling in your foot.  Your pain is not relieved with medicine. SEEK IMMEDIATE MEDICAL CARE IF:  Your toes are numb or blue.  You have severe pain that is increasing. MAKE SURE YOU:   Understand these instructions.  Will watch your condition.  Will get help right away if you are not doing well or get worse. Document Released: 07/21/2005 Document Revised: 04/14/2012 Document Reviewed: 08/02/2011 [facility] Patient Information 2015 [place], [place]. This information is not intended to replace advice given to you by your health care provider. Make sure you discuss any questions you have  with your health care provider.

## 2014-11-04 NOTE — ED Provider Notes (Signed)
CSN: [id number]     Arrival date & time 11/04/14  1322 History   First MD Initiated Contact with Patient 11/04/14 1507     Chief Complaint  Patient presents with  . Ankle Injury     (Consider location/radiation/quality/duration/timing/severity/associated sxs/prior Treatment) HPI Glade Nurselizabeth Vollman is a 34 y.o. female with history of diabetes, states was walking on her carpet when she tripped and fell. States injured left knee and left ankle and foot. Accident happened today. She is able to ambulate but very painful when she puts any weight on her left foot. She denies any numbness or weakness distal to the injury. She reports small abrasion to the knee. She denies hitting her head or loss of consciousness. No other complaints. She took ibuprofen with no improvement in her symptoms.  Past Medical History  Diagnosis Date  . Diabetes mellitus without complication    History reviewed. No pertinent past surgical history. No family history on file. History  Substance Use Topics  . Smoking status: Never Smoker   . Smokeless tobacco: Never Used  . Alcohol Use: No   OB History    No data available     Review of Systems  Constitutional: Negative for fever and chills.  Respiratory: Negative for cough, chest tightness and shortness of breath.   Cardiovascular: Negative for chest pain, palpitations and leg swelling.  Musculoskeletal: Positive for joint swelling and arthralgias. Negative for myalgias.  Skin: Negative for rash.  Neurological: Negative for dizziness, weakness and headaches.  All other systems reviewed and are negative.     Allergies  Latex  Home Medications   Prior to Admission medications   Medication Sig Start Date End Date Taking? Authorizing Provider  metFORMIN (GLUCOPHAGE) 1000 MG tablet Take 1,000 mg by mouth 2 (two) times daily with a meal.   Yes Historical Provider, MD  promethazine (PHENERGAN) 25 MG suppository Place 1 suppository (25 mg total) rectally every 6  (six) hours as needed for nausea. 04/18/14  Yes Derwood KaplanAnkit Nanavati, MD  promethazine (PHENERGAN) 25 MG tablet Take 1 tablet (25 mg total) by mouth every 6 (six) hours as needed for nausea. 04/18/14  Yes Derwood KaplanAnkit Nanavati, MD  cephALEXin (KEFLEX) 500 MG capsule Take 1 capsule (500 mg total) by mouth 4 (four) times daily. 09/05/14   Santiago GladHeather Laisure, PA-C  doxycycline (VIBRAMYCIN) 100 MG capsule Take 1 capsule (100 mg total) by mouth 2 (two) times daily. 04/18/14   Derwood KaplanAnkit Nanavati, MD  HYDROcodone-acetaminophen (NORCO/VICODIN) 5-325 MG per tablet Take 1 tablet by mouth every 4 (four) hours as needed. 09/24/14   Kristen N Ward, DO  ibuprofen (ADVIL,MOTRIN) 800 MG tablet Take 1 tablet (800 mg total) by mouth every 8 (eight) hours as needed for mild pain. 09/24/14   Kristen N Ward, DO  sulfamethoxazole-trimethoprim (SEPTRA DS) 800-160 MG per tablet Take 2 tablets by mouth every 12 (twelve) hours. 09/05/14   Santiago GladHeather Laisure, PA-C  traMADol (ULTRAM) 50 MG tablet Take 1 tablet (50 mg total) by mouth every 6 (six) hours as needed. 04/18/14   Ankit Rhunette CroftNanavati, MD   BP 127/88 mmHg  Pulse 87  Temp(Src) 98.2 F (36.8 C) (Oral)  Resp 18  Ht 5\' 5"  (1.651 m)  Wt 205 lb (92.987 kg)  BMI 34.11 kg/m2  SpO2 100%  LMP 10/18/2014 Physical Exam  Constitutional: She appears well-developed and well-nourished. No distress.  HENT:  Head: Normocephalic.  Eyes: Conjunctivae are normal.  Neck: Neck supple.  Cardiovascular: Normal rate, regular rhythm and normal heart sounds.  Pulmonary/Chest: Effort normal and breath sounds normal. No respiratory distress. She has no wheezes. She has no rales.  Musculoskeletal: She exhibits no edema.  Small superficial abrasion to the left knee. Full range of motion of the knee. Negative anterior posterior drawer signs. Mild swelling to the lateral left ankle. Pain with any range of motion. Tender to palpation over lateral malleolus. Tender palpation over fourth and fifth metatarsals. No tenderness or  defect noted over Achilles tendon. Normal rest of the foot and toes.  Neurological: She is alert.  Skin: Skin is warm and dry.  Psychiatric: She has a normal mood and affect. Her behavior is normal.  Nursing note and vitals reviewed.   ED Course  Procedures (including critical care time) Labs Review Labs Reviewed - No data to display  Imaging Review Dg Ankle Complete Left  11/04/2014   CLINICAL DATA:  Tripped over carpet, twisting the left ankle today. Lateral pain and bruising.  EXAM: LEFT ANKLE COMPLETE - 3+ VIEW  COMPARISON:  None.  FINDINGS: Plafond and talar dome appear intact. No malleolar fracture identified. No soft tissue swelling directly over the malleoli.  Small bone island at the base of the medial malleolus, benign and incidental.  No fracture or acute bony findings identified.  IMPRESSION: 1. No acute bony findings. 2. Please note that the base of the fifth metatarsal is not seen in an ideal fashion ; if the tenderness is directly over the base of the fifth metatarsal, dedicated foot radiography would be recommended.   Electronically Signed   By: Gaylyn Rong M.D.   On: 11/04/2014 14:40     EKG Interpretation None      MDM   Final diagnoses:  Ankle sprain, left, initial encounter    Patient with left leg injury after tripping on a carpet. Both knee and ankle joints are stable. Full range of motion of the knee joint, no bony tenderness. I do not think she needs any imaging at this time. X-rays of the ankle and foot are both negative. ASO provider, crutches, home with naproxen, follow-up with primary care doctor.  Filed Vitals:   11/04/14 1332 11/04/14 1627  BP: 127/88 113/80  Pulse: 87 77  Temp: 98.2 F (36.8 C) 98 F (36.7 C)  TempSrc: Oral Oral  Resp: 18 18  Height:  (1.651 m)   Weight: 205 lb (92.987 kg)   SpO2: 100% 100%     Jaynie Crumble, PA-C 11/04/14 2316  Elwin Mocha, MD 11/05/14 1526

## 2014-11-04 NOTE — ED Notes (Signed)
MD at bedside. Additional x-rays ordered

## 2014-11-04 NOTE — ED Notes (Signed)
Lt foot elevated, ice applied

## 2016-04-23 ENCOUNTER — Emergency Department (HOSPITAL_BASED_OUTPATIENT_CLINIC_OR_DEPARTMENT_OTHER)
Admission: EM | Admit: 2016-04-23 | Discharge: 2016-04-23 | Disposition: A | Discharge status: Home / Self Care | Payer: Medicaid Other | Attending: Emergency Medicine | Admitting: Emergency Medicine

## 2016-04-23 ENCOUNTER — Encounter (HOSPITAL_BASED_OUTPATIENT_CLINIC_OR_DEPARTMENT_OTHER): Payer: Self-pay | Admitting: *Deleted

## 2016-04-23 DIAGNOSIS — E119 Type 2 diabetes mellitus without complications: Secondary | ICD-10-CM | POA: Insufficient documentation

## 2016-04-23 DIAGNOSIS — Z7984 Long term (current) use of oral hypoglycemic drugs: Secondary | ICD-10-CM | POA: Diagnosis not present

## 2016-04-23 DIAGNOSIS — K0889 Other specified disorders of teeth and supporting structures: Secondary | ICD-10-CM | POA: Insufficient documentation

## 2016-04-23 MED ORDER — OXYCODONE-ACETAMINOPHEN 5-325 MG PO TABS
1.0000 | ORAL_TABLET | Freq: Once | ORAL | Status: AC
  Administered 2016-04-23: 1 via ORAL
  Filled 2016-04-23: qty 1

## 2016-04-23 NOTE — Discharge Instructions (Signed)
RESOURCE GUIDE ° °Chronic Pain Problems: °Contact [facility]  [phone] °Patients need to be referred by their primary care doctor. ° °Insufficient Money for Medicine: °Contact [employer]:  call [phone] ° °No Primary Care Doctor: °Call [facility]  [phone] - can help you locate a primary care doctor that  accepts your insurance, provides certain services, etc. °Physician Referral Service- [phone] ° °Agencies that provide inexpensive medical care: °[facility]  [phone] °[facility]  [phone] °[facility]  [phone] °[facility]  [phone] °[facility]  [phone] °[facility]  [phone] ° °Medicaid-accepting [place] Providers: °[facility]- [address] ° [phone], Mon-Fri 9am-7pm, Sat 9am-1pm °[facility]- [address], [address] ° [phone] °[facility]- [address] ° [phone] °[facility]- [address] [place] [address] ° [phone] °[facility]- [address], [phone] ° Only accepts [place] Access Medicaid patients after they have their name  applied to their card ° °Self Pay (no insurance) in [place]: °Sickle Cell Patients - [facility] ° [address], [phone] °[facility]- [address] ° [phone] °      -     [facility]- [address] °      -     [facility]- see information above (Speak to Pam H if you do not have insurance) °      -  [facility] [place]- [address],  [phone] °      -  [facility]- [address] [place] [address], [phone] °      -  Dr Osei-Bonsu-  [address], [place], [phone] °      -  [facility] - [address], [phone] °      -  [facility]- [address] [place] [address], [phone], also [address]  [address],  [phone] °      -     [facility]- [address], [phone], 1st & 3rd Saturday °        every month, 10am-1pm ° -     [facility] °  [address], [place]. °  Phone:  [phone], Fax:  [phone]. Hours of Operation:  9 am - 6 pm, M-F. ° -     [facility] °  [address], [address], [place] °  Phone: [phone], Fax: [phone]. Hours of Operation:  8:30 am - 5:30 pm, M-F. ° ° ° °Dental Assistance °If unable to pay or uninsured, contact:  [place] [facility] to become qualified for the adult dental clinic. ° °Patients with Medicaid: [facility] °[address], [phone] °[address], [phone] ° °If unable to pay, or uninsured, contact [place] [facility] ([phone] in [place], [phone] in [place]) to become qualified for the adult dental clinic ° °[facility] °[address] °[place], [place] [address] °[phone] °www.[facility].com ° °Other Low-Cost Community Dental Services: °[facility]- [address], [place], [place], [address], [phone], Ext. [phone], 2nd and 4th Thursday of the month at 6:30am.  10 clients each day by appointment, can sometimes see walk-in patients if someone does not show for an appointment. °  [facility]- [address], [place], [place], [address], [phone] °[facility]- [address], [place], [place], [address], [phone] °[facility]- [phone] °[facility]- [phone] °[facility]- [phone]  °

## 2016-04-23 NOTE — ED Provider Notes (Signed)
[facility] Provider Note   CSN: [id number] Arrival date & time: 04/23/16  1747     History   Chief Complaint Chief Complaint  Patient presents with  . Dental Pain    HPI Rebecca Hayden is a 35 y.o. female.  Patient is 35 yo F with PMH of diabetes type 2, presenting with chief complaint of bilateral lower molar pain that has been worsening over the past 2 days. Patient admits to poor dental health due to inability to afford dentist. Denies any fever, chills, trouble swallowing, change in speech, neck pain, or purulent drainage. Denies any alcohol, tobacco, or drug use.      Past Medical History:  Diagnosis Date  . Diabetes mellitus without complication (HCC)     There are no active problems to display for this patient.   History reviewed. No pertinent surgical history.  OB History    No data available       Home Medications    Prior to Admission medications   Medication Sig Start Date End Date Taking? Authorizing Provider  cephALEXin (KEFLEX) 500 MG capsule Take 1 capsule (500 mg total) by mouth 4 (four) times daily. 09/05/14   Santiago GladHeather Laisure, PA-C  doxycycline (VIBRAMYCIN) 100 MG capsule Take 1 capsule (100 mg total) by mouth 2 (two) times daily. 04/18/14   Derwood KaplanAnkit Nanavati, MD  HYDROcodone-acetaminophen (NORCO/VICODIN) 5-325 MG per tablet Take 1 tablet by mouth every 4 (four) hours as needed. 09/24/14   Kristen N Ward, DO  ibuprofen (ADVIL,MOTRIN) 800 MG tablet Take 1 tablet (800 mg total) by mouth every 8 (eight) hours as needed for mild pain. 09/24/14   Kristen N Ward, DO  metFORMIN (GLUCOPHAGE) 1000 MG tablet Take 1,000 mg by mouth 2 (two) times daily with a meal.    Historical Provider, MD  naproxen (NAPROSYN) 500 MG tablet Take 1 tablet (500 mg total) by mouth 2 (two) times daily. 11/04/14   Tatyana Kirichenko, PA-C  promethazine (PHENERGAN) 25 MG suppository Place 1 suppository (25 mg total) rectally every 6 (six) hours as needed for nausea. 04/18/14    Derwood KaplanAnkit Nanavati, MD  promethazine (PHENERGAN) 25 MG tablet Take 1 tablet (25 mg total) by mouth every 6 (six) hours as needed for nausea. 04/18/14   Derwood KaplanAnkit Nanavati, MD  sulfamethoxazole-trimethoprim (SEPTRA DS) 800-160 MG per tablet Take 2 tablets by mouth every 12 (twelve) hours. 09/05/14   Santiago GladHeather Laisure, PA-C  traMADol (ULTRAM) 50 MG tablet Take 1 tablet (50 mg total) by mouth every 6 (six) hours as needed. 04/18/14   Derwood KaplanAnkit Nanavati, MD    Family History History reviewed. No pertinent family history.  Social History Social History  Substance Use Topics  . Smoking status: Never Smoker  . Smokeless tobacco: Never Used  . Alcohol use No     Allergies   Latex   Review of Systems Review of Systems  Constitutional: Negative for chills and fever.  HENT: Positive for dental problem. Negative for drooling, facial swelling, sore throat, trouble swallowing and voice change.   Respiratory: Negative for shortness of breath, wheezing and stridor.   All other systems reviewed and are negative.    Physical Exam Updated Vital Signs BP 185/87 (BP Location: Left Arm)   Pulse 102   Temp 98 F (36.7 C) (Oral)   Resp 20   Ht 5' (1.524 m)   Wt 90.7 kg   SpO2 100%   BMI 39.06 kg/m   Physical Exam  Constitutional:  WDWN female in obvious discomfort  HENT:  Head: Normocephalic and atraumatic.  Mouth/Throat: Uvula is midline, oropharynx is clear and moist and mucous membranes are normal. No trismus in the jaw. Abnormal dentition. Dental caries present. No dental abscesses or uvula swelling.    Neck: Normal range of motion. Neck supple.  Lymphadenopathy:    She has no cervical adenopathy.  Skin: Skin is warm and dry.  Psychiatric: She has a normal mood and affect.  Nursing note and vitals reviewed.    ED Treatments / Results  Labs (all labs ordered are listed, but only abnormal results are displayed) Labs Reviewed - No data to display  EKG  EKG Interpretation None        Radiology No results found.  Procedures Procedures (including critical care time)  Medications Ordered in ED Medications  oxyCODONE-acetaminophen (PERCOCET/ROXICET) 5-325 MG per tablet 1 tablet (1 tablet Oral Given 04/23/16 1911)     Initial Impression / Assessment and Plan / ED Course  I have reviewed the triage vital signs and the nursing notes.  Pertinent labs & imaging results that were available during my care of the patient were reviewed by me and considered in my medical decision making (see chart for details).  Clinical Course   Discussed with patient the extent of her periodontal disease, and that her bilateral lower molars need to be extracted; however, the ED is not the place for this procedure. Patient is afebrile, has no evidence of dental abscess or deep space infections. Ordered one percocet for pain relief and provided d/c instructions with resources to local dental clinics.  Final Clinical Impressions(s) / ED Diagnoses   Final diagnoses:  Pain, dental    New Prescriptions Discharge Medication List as of 04/23/2016  7:13 PM       Leshia Kope Vanita Ingles II, PA 04/23/16 2003    Maia Plan, MD 04/24/16 [phone]

## 2016-04-23 NOTE — ED Triage Notes (Signed)
Pt states she has had dental issues for quite some time, but pain has been worse over the past few days (both lower molars)

## 2016-04-23 NOTE — ED Notes (Signed)
Pt given d/c instructions as per chart. Verbalizes understanding. No questions. 

## 2016-06-11 ENCOUNTER — Encounter (HOSPITAL_COMMUNITY): Payer: Self-pay | Admitting: Emergency Medicine

## 2016-06-11 ENCOUNTER — Emergency Department (HOSPITAL_COMMUNITY)
Admission: EM | Admit: 2016-06-11 | Discharge: 2016-06-11 | Disposition: A | Discharge status: Home / Self Care | Payer: Medicaid Other | Attending: Emergency Medicine | Admitting: Emergency Medicine

## 2016-06-11 DIAGNOSIS — K0889 Other specified disorders of teeth and supporting structures: Secondary | ICD-10-CM | POA: Diagnosis not present

## 2016-06-11 DIAGNOSIS — Z7984 Long term (current) use of oral hypoglycemic drugs: Secondary | ICD-10-CM | POA: Diagnosis not present

## 2016-06-11 DIAGNOSIS — E119 Type 2 diabetes mellitus without complications: Secondary | ICD-10-CM | POA: Insufficient documentation

## 2016-06-11 DIAGNOSIS — Z91040 Latex allergy status: Secondary | ICD-10-CM | POA: Diagnosis not present

## 2016-06-11 MED ORDER — LIDOCAINE VISCOUS 2 % MT SOLN: 15.0000 mL | OROMUCOSAL | 0 refills | Status: AC | PRN

## 2016-06-11 MED ORDER — NAPROXEN 500 MG PO TABS
500.0000 mg | ORAL_TABLET | Freq: Two times a day (BID) | ORAL | 0 refills | Status: AC
Start: 2016-06-11 — End: ?

## 2016-06-11 NOTE — Discharge Instructions (Signed)
You have been seen today for dental pain. You should follow up with a dentist as soon as possible. This problem will not resolve on its own without the care of a dentist. Use ibuprofen or naproxen for pain. Take all of the antibiotics in their entirety. Use the viscous lidocaine for mouth pain. Swish with the lidocaine and spit it out. Do not swallow it. ° °Dental Resource Guide ° °[facility] °[address], [address] °[place], [place] [address] °[phone] ° °[facility] °[address] °[place], [place] [address] °[phone] ° °[facility] °[address] °[place], [place] [address] °[phone] ext. [phone] ° °[facility] °[address] °[place], [place] [address] °[phone] ° °[facility] °[address] °[place], [place] [address] °[phone] ° °[employer] °Www.[facility].[employer].edu/patientcare/studentclinics/becomepatient ° °[employer] [facility] [employer] [facility] °[address] °[place], [place] [address] °[phone] ° °Website for free, low-income, or sliding scale dental services in [place]: °www.freedental.us ° °To find a dentist in [place] and surrounding areas: °www.ncdental.org/for-the-public/find-a-dentist ° °[facility] °http://www.ncdental.org/meetings-events/[place] ° ° Medicaid Dentist °https://dma.ncdhhs.gov/find-a-doctor/medicaid-dental-providers ° °

## 2016-06-11 NOTE — ED Triage Notes (Signed)
Pt reports right lower molar pain x3 days. Denies drainage and fevers.

## 2016-06-11 NOTE — ED Provider Notes (Signed)
[facility]-EMER[facility]ENCY DEPT Provider Note   CSN: [id number] Arrival date & time: 06/11/16  1522  By signing my name below, I, Soijett Blue, [id number] that this documentation has been prepared under the direction and in the presence of Khaniyah Bezek, PA-C Electronically Signed: Soijett Blue, ED Scribe. 06/11/16. 4:42 PM.   History   Chief Complaint Chief Complaint  Patient presents with  . Dental Pain    HPI Rebecca Hayden is a 35 y.o. female with a PMHx of DM, who presents to the Emergency Department complaining of gradually worsening right lower dental pain onset 3 days ago. Pt denies having a dentist at this time due to lack of insurance and finances. She states that she has not tried any medications for the relief for her symptoms. She denies fever, chills, drainage, trouble swallowing or breathing, or any other symptoms.    The history is provided by the patient. No language interpreter was used.    Past Medical History:  Diagnosis Date  . Diabetes mellitus without complication (HCC)     There are no active problems to display for this patient.   History reviewed. No pertinent surgical history.  OB History    No data available       Home Medications    Prior to Admission medications   Medication Sig Start Date End Date Taking? Authorizing Provider  cephALEXin (KEFLEX) 500 MG capsule Take 1 capsule (500 mg total) by mouth 4 (four) times daily. 09/05/14   Santiago GladHeather Laisure, PA-C  doxycycline (VIBRAMYCIN) 100 MG capsule Take 1 capsule (100 mg total) by mouth 2 (two) times daily. 04/18/14   Derwood KaplanAnkit Nanavati, MD  HYDROcodone-acetaminophen (NORCO/VICODIN) 5-325 MG per tablet Take 1 tablet by mouth every 4 (four) hours as needed. 09/24/14   Kristen N Ward, DO  ibuprofen (ADVIL,MOTRIN) 800 MG tablet Take 1 tablet (800 mg total) by mouth every 8 (eight) hours as needed for mild pain. 09/24/14   Kristen N Ward, DO  lidocaine (XYLOCAINE) 2 % solution Use as directed 15 mLs in the mouth or throat as  needed for mouth pain. 06/11/16   Saki Legore C Saivion Goettel, PA-C  metFORMIN (GLUCOPHAGE) 1000 MG tablet Take 1,000 mg by mouth 2 (two) times daily with a meal.    Historical Provider, MD  naproxen (NAPROSYN) 500 MG tablet Take 1 tablet (500 mg total) by mouth 2 (two) times daily. 06/11/16   Avontae Burkhead C Corleone Biegler, PA-C  promethazine (PHENERGAN) 25 MG suppository Place 1 suppository (25 mg total) rectally every 6 (six) hours as needed for nausea. 04/18/14   Derwood KaplanAnkit Nanavati, MD  promethazine (PHENERGAN) 25 MG tablet Take 1 tablet (25 mg total) by mouth every 6 (six) hours as needed for nausea. 04/18/14   Derwood KaplanAnkit Nanavati, MD  sulfamethoxazole-trimethoprim (SEPTRA DS) 800-160 MG per tablet Take 2 tablets by mouth every 12 (twelve) hours. 09/05/14   Santiago GladHeather Laisure, PA-C  traMADol (ULTRAM) 50 MG tablet Take 1 tablet (50 mg total) by mouth every 6 (six) hours as needed. 04/18/14   Derwood KaplanAnkit Nanavati, MD    Family History No family history on file.  Social History Social History  Substance Use Topics  . Smoking status: Never Smoker  . Smokeless tobacco: Never Used  . Alcohol use No     Allergies   Latex   Review of Systems Review of Systems  Constitutional: Negative for chills and fever.  HENT: Positive for dental problem. Negative for facial swelling, trouble swallowing and voice change.      Physical Exam Updated  Vital Signs BP 131/91 (BP Location: Left Arm)   Pulse 84   Temp 98.4 F (36.9 C) (Oral)   Resp 18   Wt 203 lb (92.1 kg)   SpO2 100%   BMI 39.65 kg/m   Physical Exam  Constitutional: She appears well-developed and well-nourished. No distress.  HENT:  Head: Normocephalic and atraumatic.  Mouth/Throat: Uvula is midline, oropharynx is clear and moist and mucous membranes are normal. No trismus in the jaw. Dental caries present. No dental abscesses.  Significant dental caries to the right mandibular second molar. No area of swelling or fluctuance to suggest abscess. No trismus. Handles oral secretions  without difficulty. No noted facial swelling.   Eyes: Conjunctivae are normal.  Neck: Neck supple.  Cardiovascular: Normal rate and regular rhythm.   Pulmonary/Chest: Effort normal.  Neurological: She is alert.  Skin: Skin is warm and dry. She is not diaphoretic.  Psychiatric: She has a normal mood and affect. Her behavior is normal.  Nursing note and vitals reviewed.   ED Treatments / Results  DIAGNOSTIC STUDIES: Oxygen Saturation is 100% on RA, nl by my interpretation.    COORDINATION OF CARE: 4:30 PM Discussed treatment plan with pt at bedside which includes viscous lidocaine, ibuprofen, referral and follow up with dentist, and pt agreed to plan.  4:30 PM- Pt offered a dental block, but declined.  4:37 PM-Following leaving patient room, it was noticed that the pt eloped.   Procedures Procedures (including critical care time)  Medications Ordered in ED Medications - No data to display   Initial Impression / Assessment and Plan / ED Course  I have reviewed the triage vital signs and the nursing notes.   Clinical Course     Patient presents with dental pain for the last 3 days. Patient was offered a dental block, but declined. Patient was told she would not be getting narcotic pain medications for this issue. Patient eloped before discharge paperwork and references for dental care could be given to the patient.    Final Clinical Impressions(s) / ED Diagnoses   Final diagnoses:  Pain, dental    New Prescriptions Discharge Medication List as of 06/11/2016  4:49 PM    START taking these medications   Details  lidocaine (XYLOCAINE) 2 % solution Use as directed 15 mLs in the mouth or throat as needed for mouth pain., Starting Wed 06/11/2016, Print       I personally performed the services described in this documentation, which was scribed in my presence. The recorded information has been reviewed and is accurate.    Anselm PancoastShawn C Miranda Frese, PA-C 06/11/16 1820    Gwyneth SproutWhitney  Plunkett, MD 06/12/16 2350

## 2016-06-11 NOTE — ED Notes (Signed)
Patient left prior to receiving discharge instructions. 

## 2021-02-16 ENCOUNTER — Other Ambulatory Visit: Payer: Self-pay

## 2021-02-16 ENCOUNTER — Emergency Department (HOSPITAL_BASED_OUTPATIENT_CLINIC_OR_DEPARTMENT_OTHER)
Admission: EM | Admit: 2021-02-16 | Discharge: 2021-02-16 | Disposition: A | Discharge status: Home / Self Care | Payer: Self-pay | Attending: Emergency Medicine | Admitting: Emergency Medicine

## 2021-02-16 ENCOUNTER — Encounter (HOSPITAL_BASED_OUTPATIENT_CLINIC_OR_DEPARTMENT_OTHER): Payer: Self-pay

## 2021-02-16 ENCOUNTER — Emergency Department (HOSPITAL_BASED_OUTPATIENT_CLINIC_OR_DEPARTMENT_OTHER): Payer: Self-pay

## 2021-02-16 DIAGNOSIS — R002 Palpitations: Secondary | ICD-10-CM | POA: Insufficient documentation

## 2021-02-16 DIAGNOSIS — Z91040 Latex allergy status: Secondary | ICD-10-CM | POA: Insufficient documentation

## 2021-02-16 DIAGNOSIS — R0602 Shortness of breath: Secondary | ICD-10-CM | POA: Insufficient documentation

## 2021-02-16 DIAGNOSIS — Z7984 Long term (current) use of oral hypoglycemic drugs: Secondary | ICD-10-CM | POA: Insufficient documentation

## 2021-02-16 DIAGNOSIS — E119 Type 2 diabetes mellitus without complications: Secondary | ICD-10-CM | POA: Insufficient documentation

## 2021-02-16 DIAGNOSIS — R110 Nausea: Secondary | ICD-10-CM | POA: Insufficient documentation

## 2021-02-16 LAB — CBC WITH DIFFERENTIAL/PLATELET
Abs Immature Granulocytes: 0.01 10*3/uL (ref 0.00–0.07)
Basophils Absolute: 0.1 10*3/uL (ref 0.0–0.1)
Basophils Relative: 1 %
Eosinophils Absolute: 0.2 10*3/uL (ref 0.0–0.5)
Eosinophils Relative: 3 %
HCT: 38 % (ref 36.0–46.0)
Hemoglobin: 12.1 g/dL (ref 12.0–15.0)
Immature Granulocytes: 0 %
Lymphocytes Relative: 54 %
Lymphs Abs: 4 10*3/uL (ref 0.7–4.0)
MCH: 24 pg — ABNORMAL LOW (ref 26.0–34.0)
MCHC: 31.8 g/dL (ref 30.0–36.0)
MCV: 75.4 fL — ABNORMAL LOW (ref 80.0–100.0)
Monocytes Absolute: 0.9 10*3/uL (ref 0.1–1.0)
Monocytes Relative: 13 %
Neutro Abs: 2.1 10*3/uL (ref 1.7–7.7)
Neutrophils Relative %: 29 %
Platelets: 209 10*3/uL (ref 150–400)
RBC: 5.04 MIL/uL (ref 3.87–5.11)
RDW: 13.7 % (ref 11.5–15.5)
WBC: 7.3 10*3/uL (ref 4.0–10.5)
nRBC: 0 % (ref 0.0–0.2)

## 2021-02-16 LAB — COMPREHENSIVE METABOLIC PANEL
ALT: 36 U/L (ref 0–44)
AST: 44 U/L — ABNORMAL HIGH (ref 15–41)
Albumin: 3.8 g/dL (ref 3.5–5.0)
Alkaline Phosphatase: 77 U/L (ref 38–126)
Anion gap: 5 (ref 5–15)
BUN: 10 mg/dL (ref 6–20)
CO2: 28 mmol/L (ref 22–32)
Calcium: 8.9 mg/dL (ref 8.9–10.3)
Chloride: 105 mmol/L (ref 98–111)
Creatinine, Ser: 0.72 mg/dL (ref 0.44–1.00)
GFR, Estimated: >60 mL/min (ref >60)
Glucose, Bld: 98 mg/dL (ref 70–99)
Potassium: 4.2 mmol/L (ref 3.5–5.1)
Sodium: 138 mmol/L (ref 135–145)
Total Bilirubin: 0.3 mg/dL (ref 0.3–1.2)
Total Protein: 8.3 g/dL — ABNORMAL HIGH (ref 6.5–8.1)

## 2021-02-16 LAB — URINALYSIS, ROUTINE W REFLEX MICROSCOPIC
Bilirubin Urine: NEGATIVE
Glucose, UA: NEGATIVE mg/dL
Hgb urine dipstick: NEGATIVE
Ketones, ur: NEGATIVE mg/dL
Leukocytes,Ua: NEGATIVE
Nitrite: NEGATIVE
Protein, ur: NEGATIVE mg/dL
Specific Gravity, Urine: 1.02 (ref 1.005–1.030)
pH: 8 (ref 5.0–8.0)

## 2021-02-16 NOTE — ED Triage Notes (Addendum)
Pt arrives EMS from hotel room with c/o intermittent shortness of breath x 1 month. Pt sts family members are telling her that she has bad sleep apnea and quits breathing at night. Reports taking oxycodone QID for chronic pain.  Pt seen at [facility] approx 6 hours ago. Has had oxycodone, and ativan pta. Requesting multiple medication refills.

## 2021-02-16 NOTE — ED Notes (Signed)
Pt screaming at staff threatening to blast them on [employer] because we would not give a dose of pain medication or benzodiazepine. Pt says she has not had a dose in 3 hours since her last hospital encounter. Support person on phone also screaming that pt is turning "blue at night and not breathing". With pt permission pt educated how these medications would worsen these symptoms. Pt refuses discharge education. Takes off from room calling staff a POS.

## 2021-02-16 NOTE — ED Provider Notes (Signed)
[facility] EMERGENCY DEPARTMENT Provider Note   CSN: [id number] Arrival date & time: 02/16/21  0128     History Chief Complaint  Patient presents with   Shortness of Breath    Rebecca Hayden is a 40 y.o. female.  Patient presents emergency room today with multiple complaints.  On my initial evaluation patient was sleeping on continuous oxygen monitor without any abnormal vital signs.  I woke her up to find out that when she falls asleep she is hard to wake up.  She also stops breathing apparently as well.  She states that her husband's mention that is gone on the last few days.  She was actually observed overnight last night in the emergency room at [facility].  There is no note in their regarding any evidence of apnea, hypoxia or any other arrhythmias.  She was there basically did get pain in anxiety medications.  She states that these are prescribed to her however on review of the records I do not see where she has a consistent prescription for either one.  She also talks about how her upper abdomen hurts.  She feels palpitations.  She has some nausea but no vomiting.  No diarrhea or constipation.  Some increased urination but no dysuria or frequency.   Shortness of Breath     Past Medical History:  Diagnosis Date   Diabetes mellitus without complication (HCC)     There are no problems to display for this patient.   History reviewed. No pertinent surgical history.   OB History   No obstetric history on file.     No family history on file.  Social History   Tobacco Use   Smoking status: Never   Smokeless tobacco: Never  Substance Use Topics   Alcohol use: No   Drug use: No    Home Medications Prior to Admission medications   Medication Sig Start Date End Date Taking? Authorizing Provider  HYDROcodone-acetaminophen (NORCO/VICODIN) 5-325 MG per tablet Take 1 tablet by mouth every 4 (four) hours as needed. 09/24/14   Ward, Layla Maw, DO  ibuprofen  (ADVIL,MOTRIN) 800 MG tablet Take 1 tablet (800 mg total) by mouth every 8 (eight) hours as needed for mild pain. 09/24/14   Ward, Layla Maw, DO  lidocaine (XYLOCAINE) 2 % solution Use as directed 15 mLs in the mouth or throat as needed for mouth pain. 06/11/16   Joy, Shawn C, PA-C  metFORMIN (GLUCOPHAGE) 1000 MG tablet Take 1,000 mg by mouth 2 (two) times daily with a meal.    [provider]  naproxen (NAPROSYN) 500 MG tablet Take 1 tablet (500 mg total) by mouth 2 (two) times daily. 06/11/16   Joy, Shawn C, PA-C  promethazine (PHENERGAN) 25 MG suppository Place 1 suppository (25 mg total) rectally every 6 (six) hours as needed for nausea. 04/18/14   Derwood Kaplan, MD  promethazine (PHENERGAN) 25 MG tablet Take 1 tablet (25 mg total) by mouth every 6 (six) hours as needed for nausea. 04/18/14   Derwood Kaplan, MD  traMADol (ULTRAM) 50 MG tablet Take 1 tablet (50 mg total) by mouth every 6 (six) hours as needed. 04/18/14   Derwood Kaplan, MD    Allergies    Latex and Metformin and related  Review of Systems   Review of Systems  Respiratory:  Positive for shortness of breath.   All other systems reviewed and are negative.  Physical Exam Updated Vital Signs BP (!) 127/96   Pulse 84  Temp 98.1 F (36.7 C) (Oral)   Resp 18   Ht 5\' 1"  (1.549 m)   Wt 72.6 kg   LMP  (LMP Unknown)   SpO2 100%   BMI 30.23 kg/m   Physical Exam Vitals and nursing note reviewed.  Constitutional:      Appearance: She is well-developed.  HENT:     Head: Normocephalic and atraumatic.     Nose: No congestion or rhinorrhea.     Mouth/Throat:     Mouth: Mucous membranes are moist.     Pharynx: Oropharynx is clear.  Eyes:     Pupils: Pupils are equal, round, and reactive to light.  Cardiovascular:     Rate and Rhythm: Normal rate and regular rhythm.  Pulmonary:     Effort: No respiratory distress.     Breath sounds: No stridor.  Abdominal:     General: Abdomen is flat. There is no distension.   Musculoskeletal:        General: Normal range of motion.     Cervical back: Normal range of motion.  Skin:    General: Skin is warm and dry.  Neurological:     General: No focal deficit present.     Mental Status: She is alert.    ED Results / Procedures / Treatments   Labs (all labs ordered are listed, but only abnormal results are displayed) Labs Reviewed  CBC WITH DIFFERENTIAL/PLATELET - Abnormal; Notable for the following components:      Result Value   MCV 75.4 (*)    MCH 24.0 (*)    All other components within normal limits  COMPREHENSIVE METABOLIC PANEL - Abnormal; Notable for the following components:   Total Protein 8.3 (*)    AST 44 (*)    All other components within normal limits  URINALYSIS, ROUTINE W REFLEX MICROSCOPIC    EKG EKG Interpretation  Date/Time:  Saturday February 16 2021 02:56:01 EDT Ventricular Rate:  76 PR Interval:  193 QRS Duration: 96 QT Interval:  401 QTC Calculation: 451 R Axis:   56 Text Interpretation: Sinus rhythm Confirmed by [id number] [phone]) on 02/16/2021 3:25:32 AM  Radiology DG Chest 2 View  Result Date: 02/16/2021 CLINICAL DATA:  Shortness of breath x1 month EXAM: CHEST - 2 VIEW COMPARISON:  02/14/2021 FINDINGS: Small calcified granuloma in the right upper lobe. Left lung is clear. No pleural effusion or pneumothorax. The heart is normal in size. Stable right hilar soft tissue prominence, related to a chronic calcified node on CT. Visualized osseous structures are within normal limits. IMPRESSION: No evidence of acute cardiopulmonary disease. Small calcified granuloma in the right upper lobe, benign. Electronically Signed   By: 02/16/2021 M.D.   On: 02/16/2021 03:25    Procedures Procedures   Medications Ordered in ED Medications - No data to display  ED Course  I have reviewed the triage vital signs and the nursing notes.  Pertinent labs & imaging results that were available during my care of the patient were  reviewed by me and considered in my medical decision making (see chart for details).    MDM Rules/Calculators/A&P                         Patient states that she needs pain medicine and anxiety medicine as she is lost her prescriptions and does have a primary doctor to follow-up with.  Once again this is inconsistent with her medical records.  I discussed with her  that the emergency room was for ruling out life-threatening emergencies and based on her labs, x-ray, EKG and multiple recent emergency department visits I did not feel that that was the case.  I thought she needed outpatient follow-up with possible sleep study however I did not see any apnea while she was here nor did they notice any [place] last night.  At this time patient does appear to be stable for discharge.  Outpatient resources provided.  Apparently patient upset with her care as she states that she is in a call the news as we did not do anything to help her out and we are bunch of 'son of a bitches'. Ambulatory out of ED.   Final Clinical Impression(s) / ED Diagnoses Final diagnoses:  Shortness of breath    Rx / DC Orders ED Discharge Orders     None        Athenia Rys, Barbara Cower, MD 02/16/21 [phone]

## 2021-05-28 ENCOUNTER — Encounter (HOSPITAL_COMMUNITY): Payer: Self-pay | Admitting: Emergency Medicine

## 2021-05-28 ENCOUNTER — Emergency Department (HOSPITAL_COMMUNITY): Payer: Self-pay

## 2021-05-28 ENCOUNTER — Emergency Department (HOSPITAL_COMMUNITY)
Admission: EM | Admit: 2021-05-28 | Discharge: 2021-05-28 | Disposition: A | Discharge status: Home / Self Care | Payer: Self-pay | Attending: Emergency Medicine | Admitting: Emergency Medicine

## 2021-05-28 DIAGNOSIS — K5732 Diverticulitis of large intestine without perforation or abscess without bleeding: Secondary | ICD-10-CM | POA: Insufficient documentation

## 2021-05-28 DIAGNOSIS — N9489 Other specified conditions associated with female genital organs and menstrual cycle: Secondary | ICD-10-CM | POA: Insufficient documentation

## 2021-05-28 DIAGNOSIS — R0602 Shortness of breath: Secondary | ICD-10-CM | POA: Insufficient documentation

## 2021-05-28 DIAGNOSIS — Z7984 Long term (current) use of oral hypoglycemic drugs: Secondary | ICD-10-CM | POA: Insufficient documentation

## 2021-05-28 DIAGNOSIS — Z91040 Latex allergy status: Secondary | ICD-10-CM | POA: Insufficient documentation

## 2021-05-28 DIAGNOSIS — E119 Type 2 diabetes mellitus without complications: Secondary | ICD-10-CM | POA: Insufficient documentation

## 2021-05-28 LAB — CBC WITH DIFFERENTIAL/PLATELET
Abs Immature Granulocytes: 0.03 10*3/uL (ref 0.00–0.07)
Basophils Absolute: 0.1 10*3/uL (ref 0.0–0.1)
Basophils Relative: 1 %
Eosinophils Absolute: 0.1 10*3/uL (ref 0.0–0.5)
Eosinophils Relative: 1 %
HCT: 41.2 % (ref 36.0–46.0)
Hemoglobin: 13.3 g/dL (ref 12.0–15.0)
Immature Granulocytes: 0 %
Lymphocytes Relative: 37 %
Lymphs Abs: 3.5 10*3/uL (ref 0.7–4.0)
MCH: 23.4 pg — ABNORMAL LOW (ref 26.0–34.0)
MCHC: 32.3 g/dL (ref 30.0–36.0)
MCV: 72.5 fL — ABNORMAL LOW (ref 80.0–100.0)
Monocytes Absolute: 0.9 10*3/uL (ref 0.1–1.0)
Monocytes Relative: 10 %
Neutro Abs: 4.7 10*3/uL (ref 1.7–7.7)
Neutrophils Relative %: 51 %
Platelets: 246 10*3/uL (ref 150–400)
RBC: 5.68 MIL/uL — ABNORMAL HIGH (ref 3.87–5.11)
RDW: 14.4 % (ref 11.5–15.5)
WBC: 9.4 10*3/uL (ref 4.0–10.5)
nRBC: 0 % (ref 0.0–0.2)

## 2021-05-28 LAB — COMPREHENSIVE METABOLIC PANEL
ALT: 47 U/L — ABNORMAL HIGH (ref 0–44)
AST: 60 U/L — ABNORMAL HIGH (ref 15–41)
Albumin: 4.1 g/dL (ref 3.5–5.0)
Alkaline Phosphatase: 74 U/L (ref 38–126)
Anion gap: 11 (ref 5–15)
BUN: 7 mg/dL (ref 6–20)
CO2: 24 mmol/L (ref 22–32)
Calcium: 9.5 mg/dL (ref 8.9–10.3)
Chloride: 98 mmol/L (ref 98–111)
Creatinine, Ser: 0.77 mg/dL (ref 0.44–1.00)
GFR, Estimated: >60 mL/min (ref >60)
Glucose, Bld: 84 mg/dL (ref 70–99)
Potassium: 3.9 mmol/L (ref 3.5–5.1)
Sodium: 133 mmol/L — ABNORMAL LOW (ref 135–145)
Total Bilirubin: 1.2 mg/dL (ref 0.3–1.2)
Total Protein: 8.9 g/dL — ABNORMAL HIGH (ref 6.5–8.1)

## 2021-05-28 LAB — I-STAT BETA HCG BLOOD, ED (MC, WL, AP ONLY): I-stat hCG, quantitative: <5 m[IU]/mL (ref <5)

## 2021-05-28 LAB — TROPONIN I (HIGH SENSITIVITY)
Troponin I (High Sensitivity): 4 ng/L (ref <18)
Troponin I (High Sensitivity): 3 ng/L (ref <18)

## 2021-05-28 LAB — LIPASE, BLOOD: Lipase: 33 U/L (ref 11–51)

## 2021-05-28 MED ORDER — AMOXICILLIN-POT CLAVULANATE 875-125 MG PO TABS: 1.0000 | ORAL_TABLET | Freq: Two times a day (BID) | ORAL | 0 refills | Status: AC

## 2021-05-28 MED ORDER — AMOXICILLIN-POT CLAVULANATE 875-125 MG PO TABS
1.0000 | ORAL_TABLET | Freq: Once | ORAL | Status: AC
  Administered 2021-05-28: 1 via ORAL
  Filled 2021-05-28: qty 1

## 2021-05-28 MED ORDER — OXYCODONE-ACETAMINOPHEN 5-325 MG PO TABS: 1.0000 | ORAL_TABLET | ORAL | 0 refills | Status: AC | PRN

## 2021-05-28 MED ORDER — HYDROCODONE-ACETAMINOPHEN 5-325 MG PO TABS
1.0000 | ORAL_TABLET | Freq: Once | ORAL | Status: AC
Start: 2021-05-28 — End: 2021-05-28
  Administered 2021-05-28: 1 via ORAL
  Filled 2021-05-28: qty 1

## 2021-05-28 MED ORDER — ONDANSETRON 4 MG PO TBDP
4.0000 mg | ORAL_TABLET | Freq: Once | ORAL | Status: AC
  Administered 2021-05-28: 4 mg via ORAL
  Filled 2021-05-28: qty 1

## 2021-05-28 MED ORDER — ONDANSETRON 4 MG PO TBDP: 4.0000 mg | ORAL_TABLET | Freq: Three times a day (TID) | ORAL | 0 refills | Status: AC | PRN

## 2021-05-28 MED ORDER — IOHEXOL 350 MG/ML SOLN
100.0000 mL | Freq: Once | INTRAVENOUS | Status: AC | PRN
  Administered 2021-05-28: 100 mL via INTRAVENOUS

## 2021-05-28 NOTE — ED Notes (Signed)
Patient transported to x-ray. ?

## 2021-05-28 NOTE — ED Provider Notes (Signed)
Emergency Medicine Provider Triage Evaluation Note  Rebecca Hayden , a 40 y.o. female  was evaluated in triage.  Pt complains of epigastric pain, suprapubic pain, and flank pain x3 days.  Additional endorses severe anxiety.  Review of Systems  Positive: Epigastric pain, nausea, suprapubic pain, flank pain Negative: Shortness of breath, palpitations, dysuria, hematuria, urinary frequency, vaginal bleeding or discharge  Physical Exam  BP 103/90 (BP Location: Right Arm)   Pulse 74   Temp 98.5 F (36.9 C) (Oral)   Resp 16   Ht 5' (1.524 m)   Wt 68.5 kg   SpO2 100%   BMI 29.49 kg/m  Gen:   Awake, no distress   Resp:  Normal effort  MSK:   Moves extremities without difficulty  Other:  Bilateral CVA tenderness to palpation in the epigastric tenderness palpation. RRR no m/r/g  Medical Decision Making  Medically screening exam initiated at 1:51 PM.  Appropriate orders placed.  Laurabeth Yip was informed that the remainder of the evaluation will be completed by another provider, this initial triage assessment does not replace that evaluation, and the importance of remaining in the ED until their evaluation is complete.  This chart was dictated using voice recognition software, Dragon. Despite the best efforts of this provider to proofread and correct errors, errors may still occur which can change documentation meaning.    Paris Lore, PA-C 05/28/21 1425    Virgina Norfolk, DO 05/28/21 1430

## 2021-05-28 NOTE — ED Triage Notes (Signed)
Pt reports upper abd pain that radiates up her chest and to her back since yesterday. Denies n/v. Also having headaches and lightheadedness.

## 2021-05-28 NOTE — ED Provider Notes (Signed)
[facility] EMERGENCY DEPARTMENT Provider Note   CSN: [id number] Arrival date & time: 05/28/21  1248     History Chief Complaint  Patient presents with   Shortness of Breath   Abdominal Pain    Rebecca Hayden is a 40 y.o. female.  The history is provided by the patient. No language interpreter was used.  Abdominal Pain Pain location:  Generalized Pain quality: aching, bloating and sharp   Pain radiates to:  Does not radiate Pain severity:  Severe Onset quality:  Gradual Duration:  2 days (Present for "months" worse over the past 2 days) Timing:  Constant Progression:  Worsening Chronicity:  Chronic Context: not alcohol use, not awakening from sleep, not diet changes, not eating, not laxative use, not medication withdrawal, not previous surgeries, not recent illness, not recent travel, not retching, not sick contacts, not suspicious food intake and not trauma   Relieved by:  Nothing Exacerbated by: Her anxiety medication and bentyl. Associated symptoms: nausea and shortness of breath (with deep breathing against her abdomen)   Associated symptoms: no anorexia, no belching, no chills, no constipation, no cough, no diarrhea, no dysuria, no fatigue, no fever, no flatus, no hematemesis, no hematochezia, no hematuria, no melena, no sore throat, no vaginal bleeding, no vaginal discharge and no vomiting       Past Medical History:  Diagnosis Date   Diabetes mellitus without complication (HCC)     There are no problems to display for this patient.   History reviewed. No pertinent surgical history.   OB History   No obstetric history on file.     No family history on file.  Social History   Tobacco Use   Smoking status: Never   Smokeless tobacco: Never  Substance Use Topics   Alcohol use: No   Drug use: No    Home Medications Prior to Admission medications   Medication Sig Start Date End Date Taking? Authorizing Provider   amoxicillin-clavulanate (AUGMENTIN) 875-125 MG tablet Take 1 tablet by mouth 2 (two) times daily for 7 days. 05/28/21 06/04/21 Yes Cailan Antonucci, PA-C  ondansetron (ZOFRAN ODT) 4 MG disintegrating tablet Take 1 tablet (4 mg total) by mouth every 8 (eight) hours as needed for nausea or vomiting. 05/28/21  Yes Malyna Budney, PA-C  oxyCODONE-acetaminophen (PERCOCET) 5-325 MG tablet Take 1 tablet by mouth every 4 (four) hours as needed. 05/28/21  Yes Gatlin Kittell, Cammy Copa, PA-C  HYDROcodone-acetaminophen (NORCO/VICODIN) 5-325 MG per tablet Take 1 tablet by mouth every 4 (four) hours as needed. 09/24/14   Ward, Layla Maw, DO  ibuprofen (ADVIL,MOTRIN) 800 MG tablet Take 1 tablet (800 mg total) by mouth every 8 (eight) hours as needed for mild pain. 09/24/14   Ward, Layla Maw, DO  lidocaine (XYLOCAINE) 2 % solution Use as directed 15 mLs in the mouth or throat as needed for mouth pain. 06/11/16   Joy, Shawn C, PA-C  metFORMIN (GLUCOPHAGE) 1000 MG tablet Take 1,000 mg by mouth 2 (two) times daily with a meal.    [provider]  naproxen (NAPROSYN) 500 MG tablet Take 1 tablet (500 mg total) by mouth 2 (two) times daily. 06/11/16   Joy, Shawn C, PA-C  promethazine (PHENERGAN) 25 MG suppository Place 1 suppository (25 mg total) rectally every 6 (six) hours as needed for nausea. 04/18/14   Derwood Kaplan, MD  promethazine (PHENERGAN) 25 MG tablet Take 1 tablet (25 mg total) by mouth every 6 (six) hours as needed for nausea. 04/18/14   Nanavati,  Ankit, MD  traMADol (ULTRAM) 50 MG tablet Take 1 tablet (50 mg total) by mouth every 6 (six) hours as needed. 04/18/14   Derwood Kaplan, MD    Allergies    Latex, Metformin and related, Bentyl [dicyclomine], and Hydrochlorothiazide  Review of Systems   Review of Systems  Constitutional:  Negative for chills, fatigue and fever.  HENT:  Negative for sore throat.   Respiratory:  Positive for shortness of breath (with deep breathing against her abdomen). Negative for  cough.   Gastrointestinal:  Positive for abdominal pain and nausea. Negative for anorexia, constipation, diarrhea, flatus, hematemesis, hematochezia, melena and vomiting.  Genitourinary:  Negative for dysuria, hematuria, vaginal bleeding and vaginal discharge.   Physical Exam Updated Vital Signs BP 99/80   Pulse 64   Temp 98.1 F (36.7 C)   Resp 16   Ht 5' (1.524 m)   Wt 68.5 kg   SpO2 99%   BMI 29.49 kg/m   Physical Exam Vitals and nursing note reviewed.  Constitutional:      General: She is not in acute distress.    Appearance: She is well-developed. She is not diaphoretic.  HENT:     Head: Normocephalic and atraumatic.     Right Ear: External ear normal.     Left Ear: External ear normal.     Nose: Nose normal.     Mouth/Throat:     Mouth: Mucous membranes are moist.  Eyes:     General: No scleral icterus.    Conjunctiva/sclera: Conjunctivae normal.  Cardiovascular:     Rate and Rhythm: Normal rate and regular rhythm.     Heart sounds: Normal heart sounds. No murmur heard.   No friction rub. No gallop.  Pulmonary:     Effort: Pulmonary effort is normal. No respiratory distress.     Breath sounds: Normal breath sounds.  Abdominal:     General: Bowel sounds are normal. There is no distension.     Palpations: Abdomen is soft. There is no mass.     Tenderness: There is generalized abdominal tenderness. There is no guarding.  Musculoskeletal:     Cervical back: Normal range of motion.  Skin:    General: Skin is warm and dry.  Neurological:     Mental Status: She is alert and oriented to person, place, and time.  Psychiatric:        Behavior: Behavior normal.    ED Results / Procedures / Treatments   Labs (all labs ordered are listed, but only abnormal results are displayed) Labs Reviewed  COMPREHENSIVE METABOLIC PANEL - Abnormal; Notable for the following components:      Result Value   Sodium 133 (*)    Total Protein 8.9 (*)    AST 60 (*)    ALT 47 (*)     All other components within normal limits  CBC WITH DIFFERENTIAL/PLATELET - Abnormal; Notable for the following components:   RBC 5.68 (*)    MCV 72.5 (*)    MCH 23.4 (*)    All other components within normal limits  LIPASE, BLOOD  I-STAT BETA HCG BLOOD, ED ([facility], [facility], [facility] ONLY)  TROPONIN I (HIGH SENSITIVITY)  TROPONIN I (HIGH SENSITIVITY)    EKG None  Radiology DG Chest 2 View  Result Date: 05/28/2021 CLINICAL DATA:  Chest pain and back pain. EXAM: CHEST - 2 VIEW COMPARISON:  May 24, 2021 FINDINGS: A stable right upper lobe calcified granuloma is seen. There is no evidence of acute infiltrate,  pleural effusion or pneumothorax. The heart size and mediastinal contours are within normal limits. The visualized skeletal structures are unremarkable. IMPRESSION: No active cardiopulmonary disease. Electronically Signed   By: Aram Candela M.D.   On: 05/28/2021 19:06   CT ABDOMEN PELVIS W CONTRAST  Result Date: 05/28/2021 CLINICAL DATA:  Abdominal abscess/infection suspected. EXAM: CT ABDOMEN AND PELVIS WITH CONTRAST TECHNIQUE: Multidetector CT imaging of the abdomen and pelvis was performed using the standard protocol following bolus administration of intravenous contrast. CONTRAST:  OMNIPAQUE IOHEXOL 350 MG/ML SOLN COMPARISON:  April 16, 2021 FINDINGS: Lower chest: No acute abnormality. Hepatobiliary: A 6 mm focus of parenchymal low attenuation is seen within the posterior aspect of the right lobe of the liver (axial CT image 24, CT series 3). This is too small to characterize by CT examination. No gallstones, gallbladder wall thickening, or biliary dilatation. Pancreas: Unremarkable. No pancreatic ductal dilatation or surrounding inflammatory changes. Spleen: Punctate calcified granulomas are seen within an otherwise normal-appearing spleen Adrenals/Urinary Tract: Adrenal glands are unremarkable. Kidneys are normal, without renal calculi, focal lesion, or hydronephrosis. Bladder is  unremarkable. Stomach/Bowel: Stomach is within normal limits. Appendix appears normal. No evidence of bowel dilatation. Multiple diverticula are seen throughout the descending and sigmoid colon. Mild, stable thickening of the proximal sigmoid colon is seen (axial CT images 72 through 80, CT series 3). No associated pericolonic inflammatory fat stranding is noted Vascular/Lymphatic: No significant vascular findings are present. No enlarged abdominal or pelvic lymph nodes. Reproductive: Uterus and bilateral adnexa are unremarkable. Other: No abdominal wall hernia or abnormality. No abdominopelvic ascites. Musculoskeletal: No acute or significant osseous findings. IMPRESSION: 1. Colonic diverticulosis with mild stable thickening of the proximal sigmoid colon. Very mild sigmoid diverticulitis cannot be excluded. 2. Subcentimeter focus of low attenuation within the right lobe of the liver which is too small to characterize by CT examination. Correlation with nonemergent hepatic ultrasound is recommended. Electronically Signed   By: Aram Candela M.D.   On: 05/28/2021 21:27    Procedures Procedures   Medications Ordered in ED Medications  HYDROcodone-acetaminophen (NORCO/VICODIN) 5-325 MG per tablet 1 tablet (1 tablet Oral Given 05/28/21 1930)  ondansetron (ZOFRAN-ODT) disintegrating tablet 4 mg (4 mg Oral Given 05/28/21 1930)  iohexol (OMNIPAQUE) 350 MG/ML injection 100 mL (100 mLs Intravenous Contrast Given 05/28/21 2118)  amoxicillin-clavulanate (AUGMENTIN) 875-125 MG per tablet 1 tablet (1 tablet Oral Given 05/28/21 2218)    ED Course  I have reviewed the triage vital signs and the nursing notes.  Pertinent labs & imaging results that were available during my care of the patient were reviewed by me and considered in my medical decision making (see chart for details).    MDM Rules/Calculators/A&P                           CC:abd pain VS:  Vitals:   05/28/21 1327 05/28/21 1328 05/28/21 1646  05/28/21 2219  BP: 103/90  106/83 99/80  Pulse: 74  79 64  Resp: 16  17 16   Temp: 98.5 F (36.9 C)  98.1 F (36.7 C) 98.1 F (36.7 C)  TempSrc: Oral     SpO2: 100%  100% 99%  Weight:  68.5 kg    Height:  5' (1.524 m)      12-30-1990 is gathered by patient and emr. Previous records obtained and reviewed. DDX:The patient's complaint of abd pain involves an extensive number of diagnostic and treatment options, and is a complaint  that carries with it a high risk of complications, morbidity, and potential mortality. Given the large differential diagnosis, medical decision making is of high complexity. The differential diagnosis for generalized abdominal pain includes, but is not limited to AAA, gastroenteritis, appendicitis, Bowel obstruction, Bowel perforation. Gastroparesis, DKA, Hernia, Inflammatory bowel disease, mesenteric ischemia, pancreatitis, peritonitis SBP, volvulus.  Labs: I ordered reviewed and interpreted labs which include CBC without leukocytosis or other significant abnormality CMP with mild hyponatremia, mildly elevated protein of unknown significance, mild elevation in AST and ALT Lipase within normal limits as well as troponin, Negative pregnancy test. Imaging: I ordered and reviewed images which included 2 view chest x-ray and CT abdomen and pelvis. I independently visualized and interpreted all imaging. Significant findings include thickening of the proximal sigmoid colon suspicious for diverticulitis, hypoattenuating node in the liver. There are otherwise no acute, significant findings on chest x-ray EKG: Normal sinus rhythm at a rate of 73 Consults: MDM: Patient here with complaint of abdominal pain.  Appears she has diverticulitis.  Patient PDMP reviewed.  Patient will be given pain medications, antiemetics, Augmentin for treatment.  Patient is remain on clear liquids for the next several days.  Discussed liver findings and need for outpatient ultrasound and follow-up.   Patient appears otherwise appropriate for discharge at this time. Patient disposition:The patient appears reasonably screened and/or stabilized for discharge and I doubt any other medical condition or other [facility] requiring further screening, evaluation, or treatment in the ED at this time prior to discharge. I have discussed lab and/or imaging findings with the patient and answered all questions/concerns to the best of my ability.I have discussed return precautions and OP follow up.   Final Clinical Impression(s) / ED Diagnoses Final diagnoses:  Diverticulitis of large intestine without perforation or abscess without bleeding    Rx / DC Orders ED Discharge Orders          Ordered    amoxicillin-clavulanate (AUGMENTIN) 875-125 MG tablet  2 times daily        05/28/21 2156    oxyCODONE-acetaminophen (PERCOCET) 5-325 MG tablet  Every 4 hours PRN        05/28/21 2156    ondansetron (ZOFRAN ODT) 4 MG disintegrating tablet  Every 8 hours PRN        05/28/21 2156             Arthor Captain, PA-C 05/29/21 0936    Sloan Leiter, DO 05/29/21 1751

## 2021-05-28 NOTE — ED Notes (Signed)
IV attempted x2 another RN to try.  

## 2021-05-28 NOTE — Discharge Instructions (Signed)
Contact a health care provider if: Your pain does not improve. Your bowel movements do not return to normal. Get help right away if: Your pain gets worse. Your symptoms do not get better with treatment. Your symptoms suddenly get worse. You have a fever. You vomit more than one time. You have stools that are bloody, black, or tarry. 

## 2021-06-28 ENCOUNTER — Encounter (HOSPITAL_COMMUNITY): Payer: Self-pay | Admitting: Radiology

## 2021-11-15 ENCOUNTER — Emergency Department (HOSPITAL_COMMUNITY)
Admission: EM | Admit: 2021-11-15 | Discharge: 2021-11-15 | Discharge status: Against Medical Advice | Payer: Medicaid Other | Attending: Emergency Medicine | Admitting: Emergency Medicine

## 2021-11-15 ENCOUNTER — Encounter (HOSPITAL_COMMUNITY): Payer: Self-pay | Admitting: Pharmacy Technician

## 2021-11-15 DIAGNOSIS — Z5321 Procedure and treatment not carried out due to patient leaving prior to being seen by health care provider: Secondary | ICD-10-CM | POA: Insufficient documentation

## 2021-11-15 DIAGNOSIS — R110 Nausea: Secondary | ICD-10-CM | POA: Insufficient documentation

## 2021-11-15 DIAGNOSIS — R1032 Left lower quadrant pain: Secondary | ICD-10-CM | POA: Insufficient documentation

## 2021-11-15 DIAGNOSIS — R0602 Shortness of breath: Secondary | ICD-10-CM | POA: Insufficient documentation

## 2021-11-15 LAB — URINALYSIS, ROUTINE W REFLEX MICROSCOPIC
Bilirubin Urine: NEGATIVE
Glucose, UA: NEGATIVE mg/dL
Hgb urine dipstick: NEGATIVE
Ketones, ur: NEGATIVE mg/dL
Nitrite: NEGATIVE
Protein, ur: NEGATIVE mg/dL
Specific Gravity, Urine: 1.026 (ref 1.005–1.030)
pH: 6 (ref 5.0–8.0)

## 2021-11-15 LAB — COMPREHENSIVE METABOLIC PANEL
ALT: 77 U/L — ABNORMAL HIGH (ref 0–44)
AST: 88 U/L — ABNORMAL HIGH (ref 15–41)
Albumin: 3.7 g/dL (ref 3.5–5.0)
Alkaline Phosphatase: 65 U/L (ref 38–126)
Anion gap: 4 — ABNORMAL LOW (ref 5–15)
BUN: 11 mg/dL (ref 6–20)
CO2: 30 mmol/L (ref 22–32)
Calcium: 9 mg/dL (ref 8.9–10.3)
Chloride: 104 mmol/L (ref 98–111)
Creatinine, Ser: 0.73 mg/dL (ref 0.44–1.00)
GFR, Estimated: >60 mL/min (ref >60)
Glucose, Bld: 87 mg/dL (ref 70–99)
Potassium: 3.9 mmol/L (ref 3.5–5.1)
Sodium: 138 mmol/L (ref 135–145)
Total Bilirubin: 0.6 mg/dL (ref 0.3–1.2)
Total Protein: 7.7 g/dL (ref 6.5–8.1)

## 2021-11-15 LAB — CBC
HCT: 38.6 % (ref 36.0–46.0)
Hemoglobin: 12.2 g/dL (ref 12.0–15.0)
MCH: 24 pg — ABNORMAL LOW (ref 26.0–34.0)
MCHC: 31.6 g/dL (ref 30.0–36.0)
MCV: 76 fL — ABNORMAL LOW (ref 80.0–100.0)
Platelets: 211 10*3/uL (ref 150–400)
RBC: 5.08 MIL/uL (ref 3.87–5.11)
RDW: 14.1 % (ref 11.5–15.5)
WBC: 7.6 10*3/uL (ref 4.0–10.5)
nRBC: 0 % (ref 0.0–0.2)

## 2021-11-15 LAB — LIPASE, BLOOD: Lipase: 31 U/L (ref 11–51)

## 2021-11-15 LAB — I-STAT BETA HCG BLOOD, ED (MC, WL, AP ONLY): I-stat hCG, quantitative: <5 m[IU]/mL (ref <5)

## 2021-11-15 NOTE — ED Notes (Signed)
Pt called again for vitals reassessment, no answer.  ?

## 2021-11-15 NOTE — ED Notes (Signed)
Pt called X 3 to have vitals reassessed. No answer. Will try again later. ?

## 2021-11-15 NOTE — ED Provider Triage Note (Signed)
Emergency Medicine Provider Triage Evaluation Note ? ?Rebecca Hayden , a 41 y.o. female  was evaluated in triage.  Pt complains of abdominal pain for 4 days.  History of diverticulosis, unsure if she is having a flare. ? ?Review of Systems  ?Positive: Abdominal pain, nausea ?Negative: Fever, Chills, vomiting, diarrhea ? ?Physical Exam  ?BP 104/70   Pulse 64   Temp 98.1 ?F (36.7 ?C) (Oral)   Resp 16   SpO2 96%  ?Gen:   Awake, no distress   ?Resp:  Normal effort  ?MSK:   Moves extremities without difficulty  ?Other:   ? ?Medical Decision Making  ?Medically screening exam initiated at 4:36 PM.  Appropriate orders placed.  Rebecca Hayden was informed that the remainder of the evaluation will be completed by another provider, this initial triage assessment does not replace that evaluation, and the importance of remaining in the ED until their evaluation is complete. ? ? ?  ?Su Monks, PA-C ?11/15/21 1636 ? ?

## 2021-11-15 NOTE — ED Triage Notes (Signed)
Pt here with reports of L sided abdominal pain onset several days ago along with nausea. States hx of diverticulits. Pt also says the pain makes it difficult to breathe.  ?

## 2023-05-19 IMAGING — CT CT ABD-PELV W/ CM
2 of 4 series · 16 of 46 positions shown, 18 images · IV contrast (omnipaque)
Comparison: April 16, 2021

CLINICAL DATA: Abdominal abscess/infection suspected.

EXAM:
CT ABDOMEN AND PELVIS WITH CONTRAST
TECHNIQUE: Multidetector CT imaging of the abdomen and pelvis was performed
using the standard protocol following bolus administration of
intravenous contrast.
CONTRAST:  100mL OMNIPAQUE IOHEXOL 350 MG/ML SOLN

[Series 3: abd/ pelvis 5.0 i30f 2 · axial · 0.81mm/px · z∈[+860,+1300]mm · 13 of 96 slices shown, 15 images]
[im 4/96  soft-tissue]
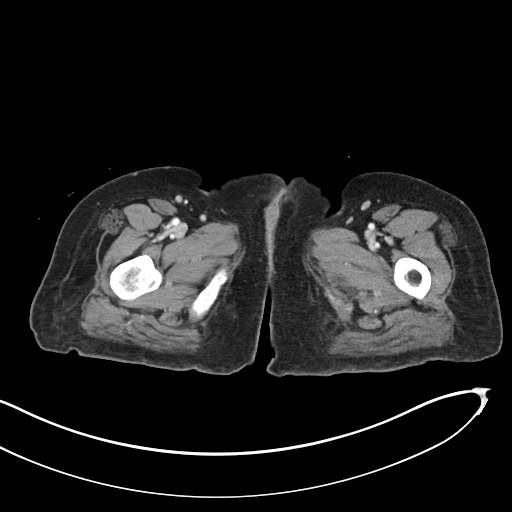
[im 4/96  bone]
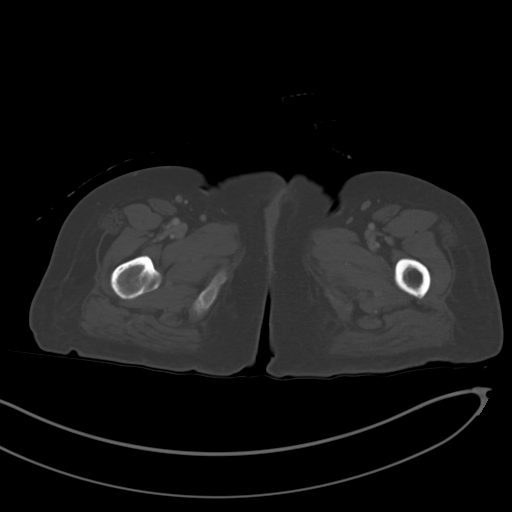
[im 12/96  soft-tissue]
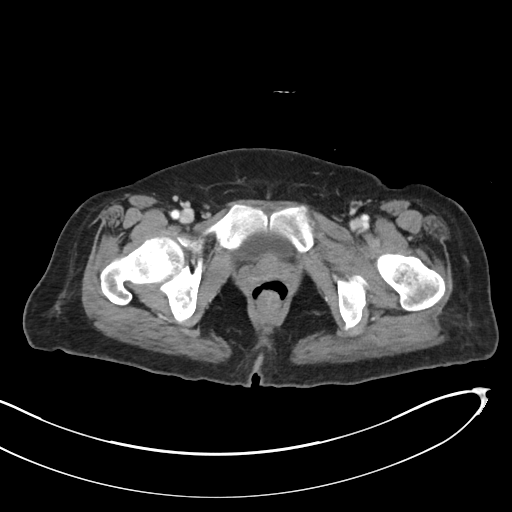
[im 20/96  soft-tissue]
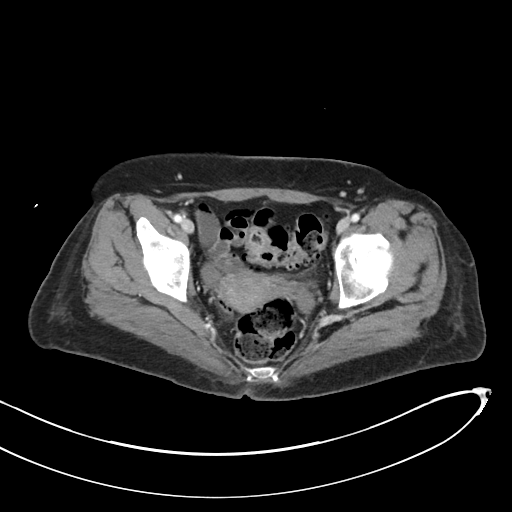
[im 28/96  soft-tissue]
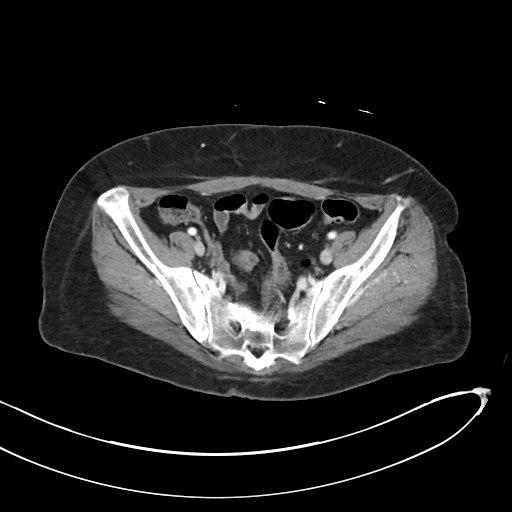
[im 32/96  soft-tissue]
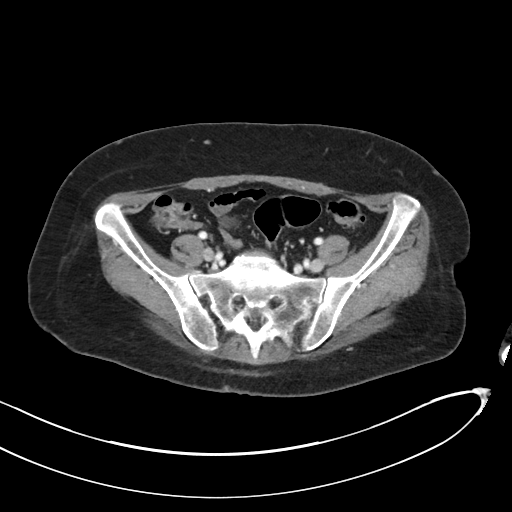
[im 40/96  soft-tissue]
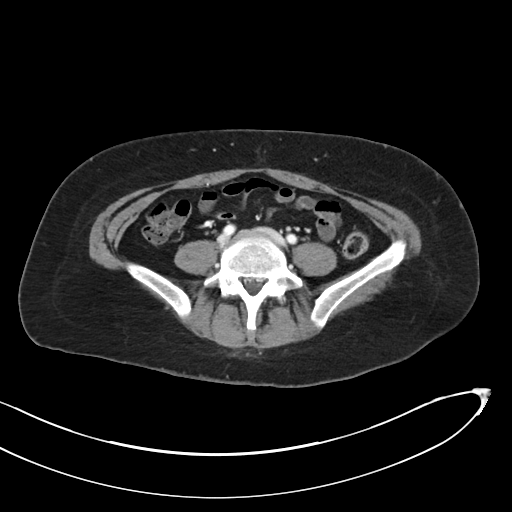
[im 48/96  soft-tissue]
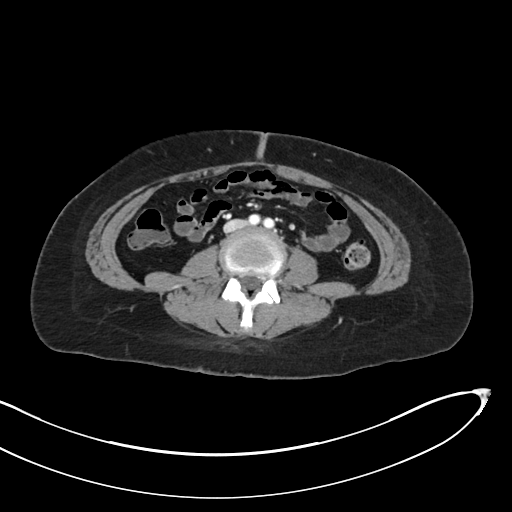
[im 56/96  soft-tissue]
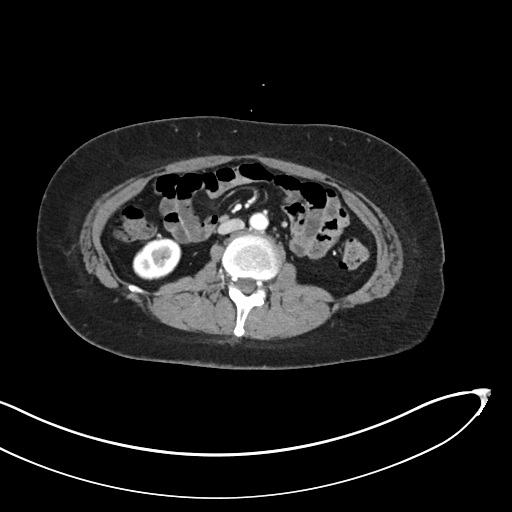
[im 64/96  soft-tissue]
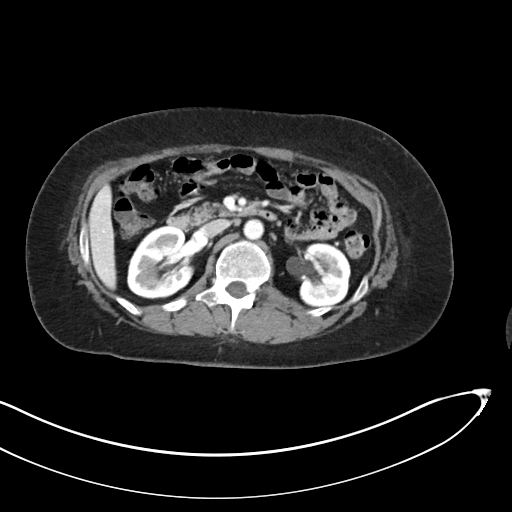
[im 64/96  bone]
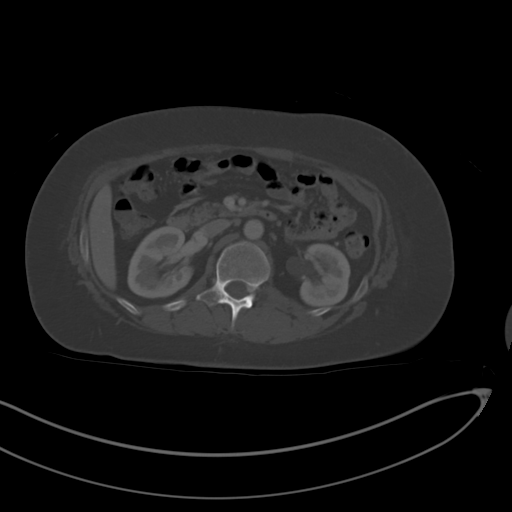
[im 68/96  soft-tissue]
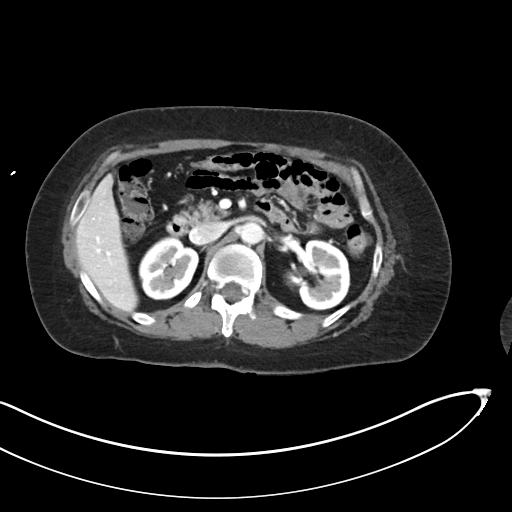
[im 76/96  soft-tissue]
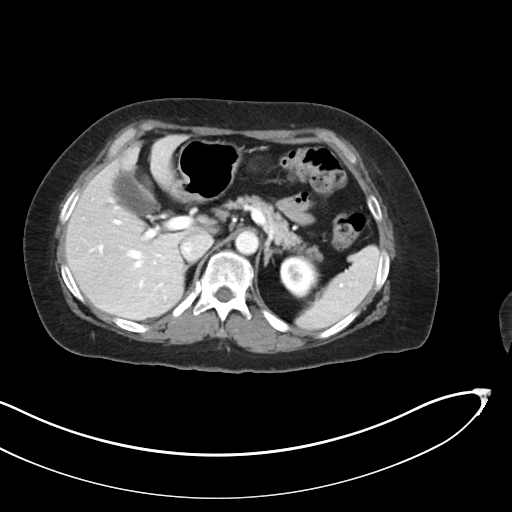
[im 84/96  soft-tissue]
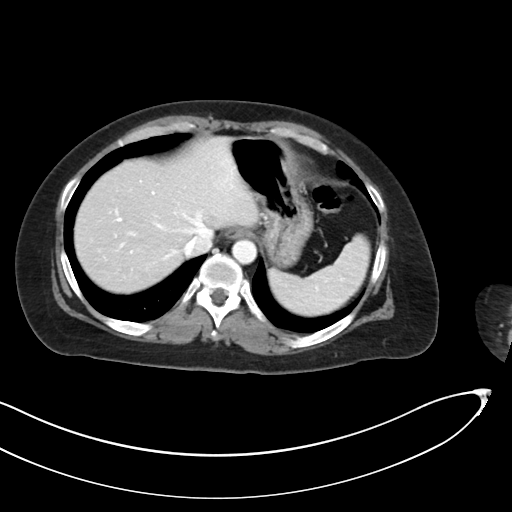
[im 92/96  soft-tissue]
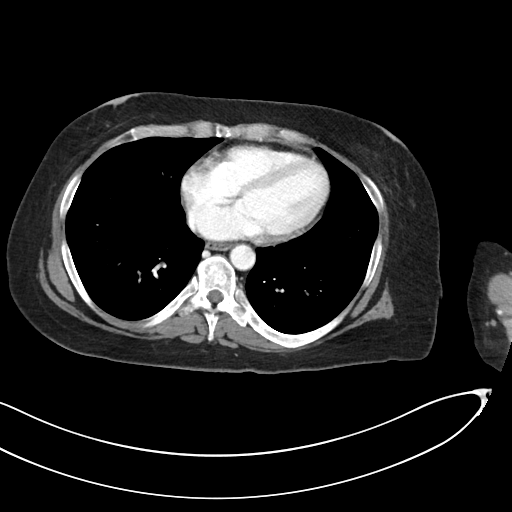

[Series 6: coronal soft tissue · coronal · 0.75mm/px · 3 of 80 slices shown]
[im 27/80  soft-tissue]
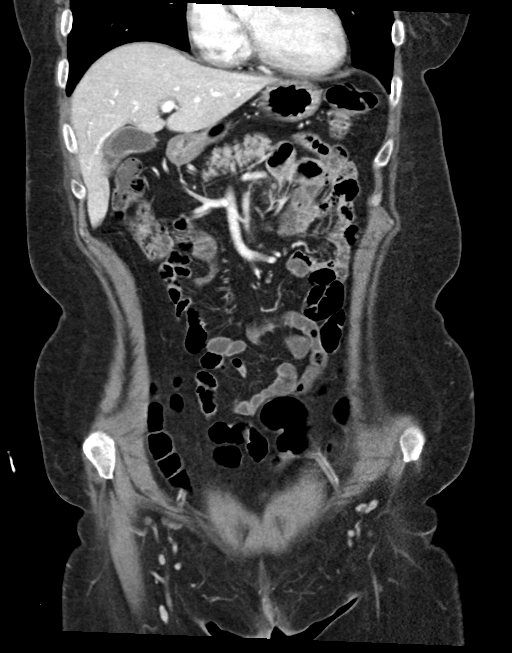
[im 36/80  soft-tissue]
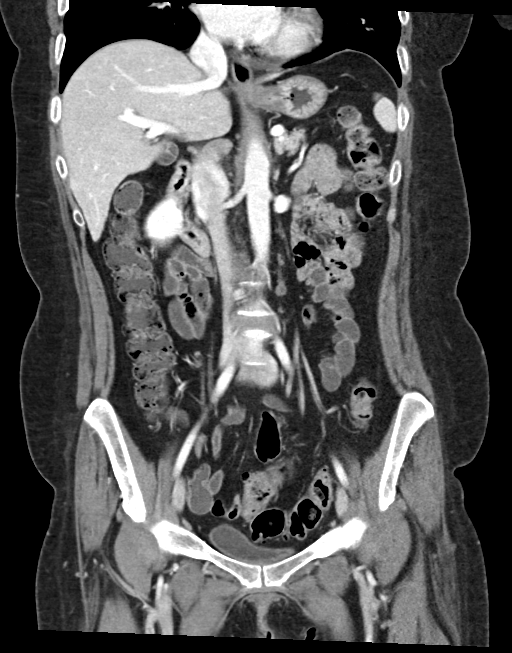
[im 44/80  soft-tissue]
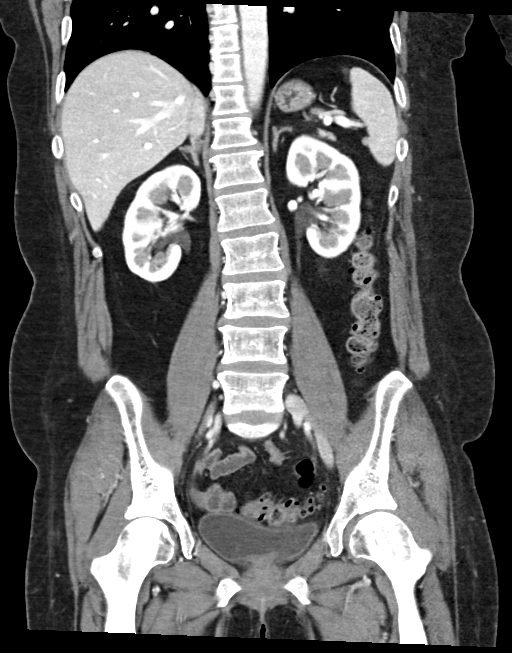

[16 of 46 positions shown; findings below may reference images not displayed]

FINDINGS: Lower chest: No acute abnormality.

Hepatobiliary: A 6 mm focus of parenchymal low attenuation is seen
within the posterior aspect of the right lobe of the liver (axial CT
image 24, CT series 3). This is too small to characterize by CT
examination. No gallstones, gallbladder wall thickening, or biliary
dilatation.

Pancreas: Unremarkable. No pancreatic ductal dilatation or
surrounding inflammatory changes.

Spleen: Punctate calcified granulomas are seen within an otherwise
normal-appearing spleen

Adrenals/Urinary Tract: Adrenal glands are unremarkable. Kidneys are
normal, without renal calculi, focal lesion, or hydronephrosis.
Bladder is unremarkable.

Stomach/Bowel: Stomach is within normal limits. Appendix appears
normal. No evidence of bowel dilatation. Multiple diverticula are
seen throughout the descending and sigmoid colon. Mild, stable
thickening of the proximal sigmoid colon is seen (axial CT images 72
through 80, CT series 3). No associated pericolonic inflammatory fat
stranding is noted

Vascular/Lymphatic: No significant vascular findings are present. No
enlarged abdominal or pelvic lymph nodes.

Reproductive: Uterus and bilateral adnexa are unremarkable.

Other: No abdominal wall hernia or abnormality. No abdominopelvic
ascites.

Musculoskeletal: No acute or significant osseous findings.
IMPRESSION: 1. Colonic diverticulosis with mild stable thickening of the
proximal sigmoid colon. Very mild sigmoid diverticulitis cannot be
excluded.
2. Subcentimeter focus of low attenuation within the right lobe of
the liver which is too small to characterize by CT examination.
Correlation with nonemergent hepatic ultrasound is recommended.

## 2023-05-19 IMAGING — CR DG CHEST 2V
2 series · 2 of 2 positions shown · non-contrast
Comparison: May 24, 2021

CLINICAL DATA: Chest pain and back pain.

EXAM:
CHEST - 2 VIEW

[chest pa]
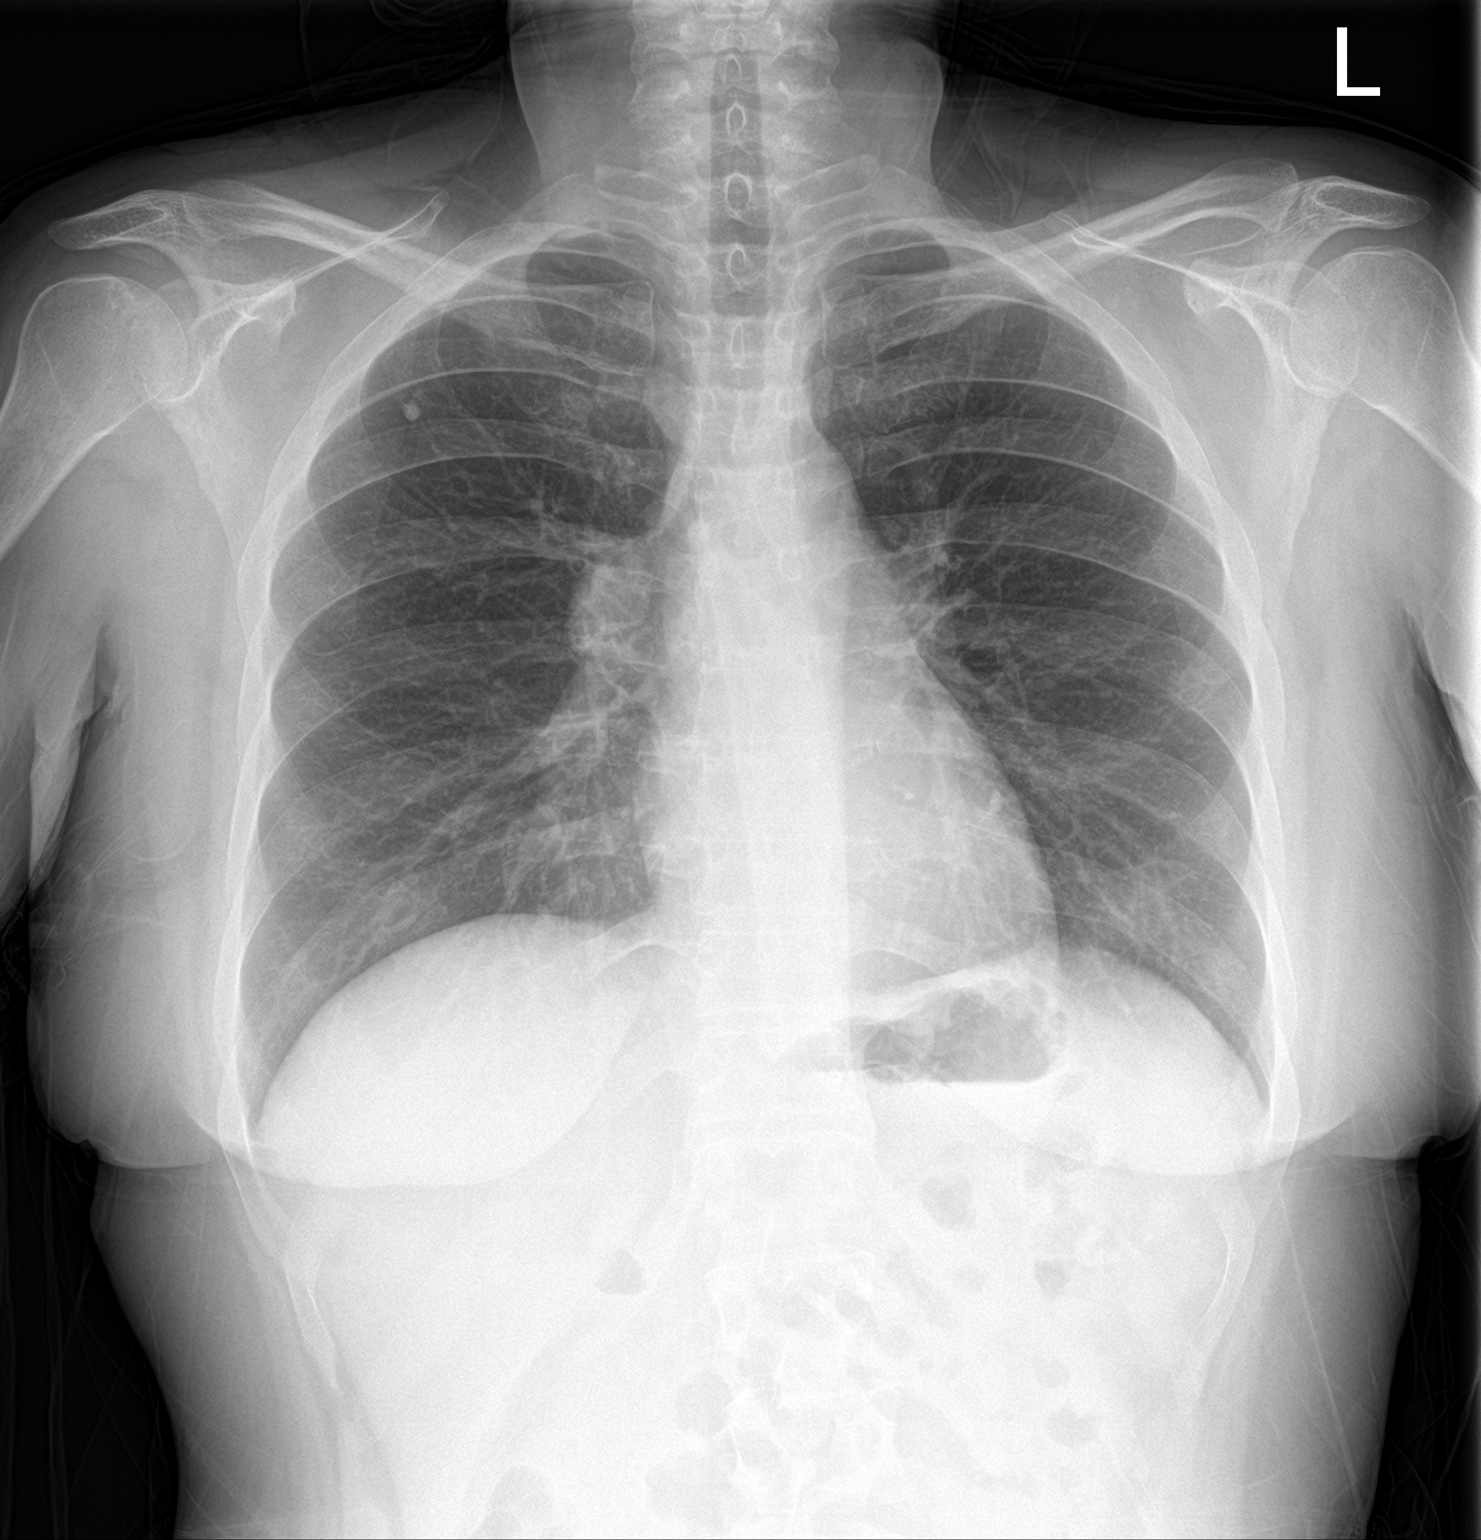

[chest lat]
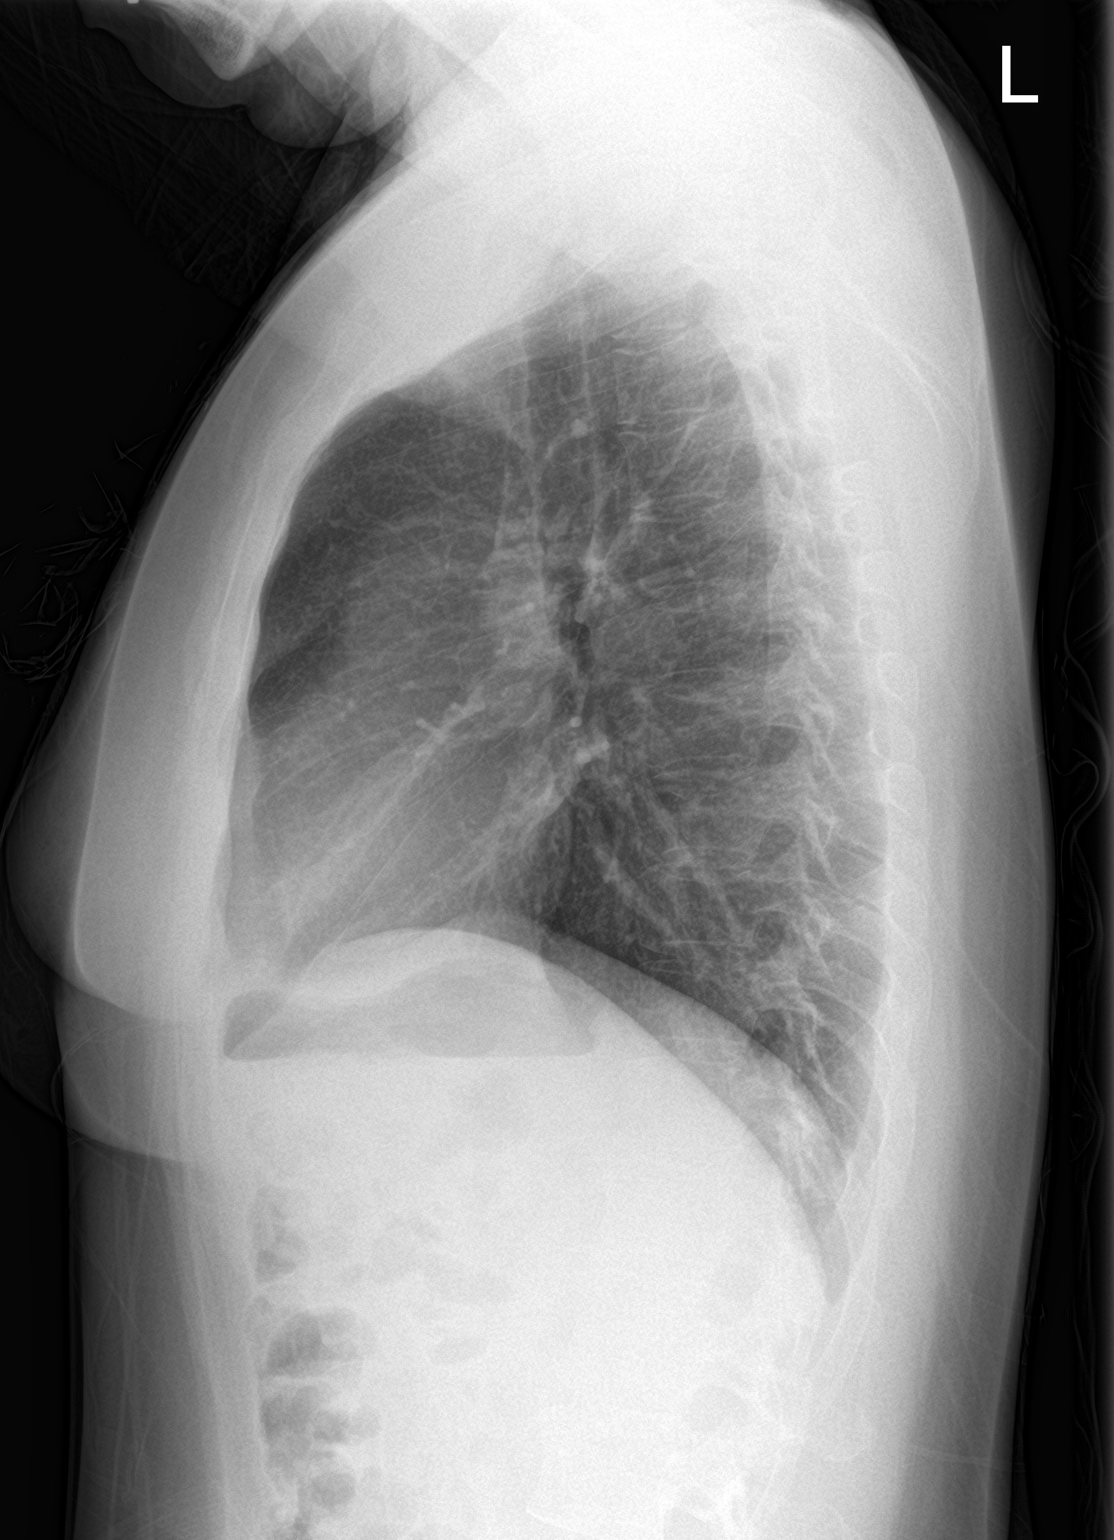

[2 of 2 positions shown; findings below may reference images not displayed]

FINDINGS: A stable right upper lobe calcified granuloma is seen. There is no
evidence of acute infiltrate, pleural effusion or pneumothorax. The
heart size and mediastinal contours are within normal limits. The
visualized skeletal structures are unremarkable.
IMPRESSION: No active cardiopulmonary disease.
